# Patient Record
Sex: Male | Born: 1950 | Race: White | Hispanic: No | Marital: Married | Smoking: Never smoker
Health system: Southern US, Community
[De-identification: ages and names within clinical notes are randomized; demographics above are authoritative.]

## PROBLEM LIST (undated history)

## (undated) DIAGNOSIS — J45909 Unspecified asthma, uncomplicated: Secondary | ICD-10-CM

## (undated) DIAGNOSIS — J302 Other seasonal allergic rhinitis: Secondary | ICD-10-CM

## (undated) DIAGNOSIS — I350 Nonrheumatic aortic (valve) stenosis: Secondary | ICD-10-CM

## (undated) HISTORY — DX: Nonrheumatic aortic (valve) stenosis: I35.0

## (undated) HISTORY — PX: OTHER SURGICAL HISTORY: SHX169

## (undated) HISTORY — PX: X-STOP IMPLANTATION: SHX2677

## (undated) HISTORY — DX: Unspecified asthma, uncomplicated: J45.909

## (undated) HISTORY — DX: Other seasonal allergic rhinitis: J30.2

---

## 2014-02-06 ENCOUNTER — Other Ambulatory Visit: Payer: Self-pay | Admitting: Orthopedic Surgery

## 2014-02-06 DIAGNOSIS — M48061 Spinal stenosis, lumbar region without neurogenic claudication: Secondary | ICD-10-CM

## 2014-02-10 ENCOUNTER — Other Ambulatory Visit: Payer: Self-pay

## 2014-02-10 ENCOUNTER — Ambulatory Visit
Admission: RE | Admit: 2014-02-10 | Discharge: 2014-02-10 | Disposition: A | Discharge status: Home / Self Care | Payer: Federal, State, Local not specified - PPO | Source: Ambulatory Visit | Attending: Orthopedic Surgery | Admitting: Orthopedic Surgery

## 2014-02-10 VITALS — BP 145/82 | HR 69

## 2014-02-10 DIAGNOSIS — M48061 Spinal stenosis, lumbar region without neurogenic claudication: Secondary | ICD-10-CM

## 2014-02-10 MED ORDER — IOHEXOL 180 MG/ML  SOLN: 1.0000 mL | Freq: Once | INTRAMUSCULAR | Status: AC | PRN

## 2014-02-10 MED ORDER — METHYLPREDNISOLONE ACETATE 40 MG/ML INJ SUSP (RADIOLOG: 120.0000 mg | Freq: Once | INTRAMUSCULAR | Status: DC

## 2014-02-10 NOTE — Discharge Instructions (Signed)

## 2014-09-09 ENCOUNTER — Ambulatory Visit (INDEPENDENT_AMBULATORY_CARE_PROVIDER_SITE_OTHER)
Admission: RE | Admit: 2014-09-09 | Discharge: 2014-09-09 | Disposition: A | Discharge status: Home / Self Care | Payer: Federal, State, Local not specified - PPO | Source: Ambulatory Visit | Attending: Pulmonary Disease | Admitting: Pulmonary Disease

## 2014-09-09 ENCOUNTER — Encounter: Payer: Self-pay | Admitting: Pulmonary Disease

## 2014-09-09 ENCOUNTER — Ambulatory Visit (INDEPENDENT_AMBULATORY_CARE_PROVIDER_SITE_OTHER): Payer: Federal, State, Local not specified - PPO | Admitting: Pulmonary Disease

## 2014-09-09 ENCOUNTER — Encounter (INDEPENDENT_AMBULATORY_CARE_PROVIDER_SITE_OTHER): Payer: Self-pay

## 2014-09-09 VITALS — BP 116/68 | HR 68 | Ht 68.0 in | Wt 175.0 lb

## 2014-09-09 DIAGNOSIS — R05 Cough: Secondary | ICD-10-CM

## 2014-09-09 DIAGNOSIS — R053 Chronic cough: Secondary | ICD-10-CM

## 2014-09-09 IMAGING — CR DG CHEST 2V
2 series · 2 of 2 positions shown · non-contrast
Comparison: None.

CLINICAL DATA: Chronic cough, congestion for 5 months.

EXAM:
CHEST  2 VIEW

[view not recorded (1 of 2)]
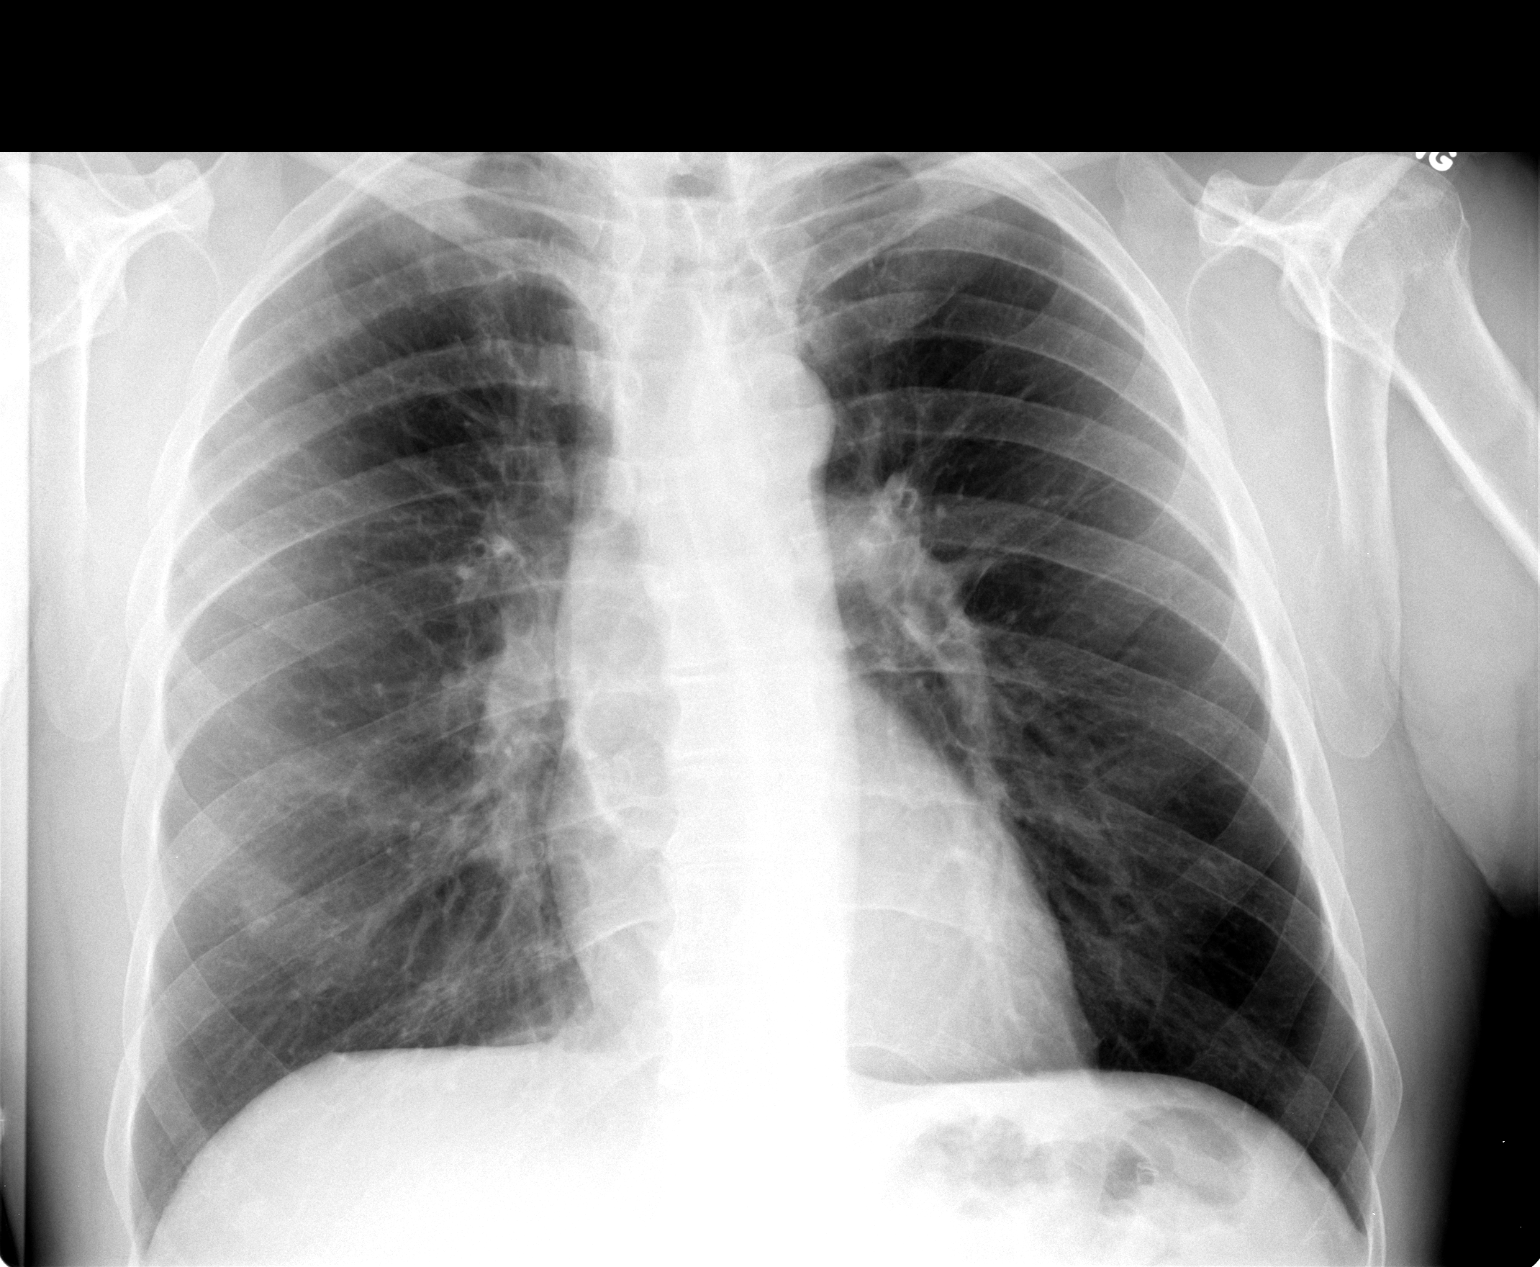

[view not recorded (2 of 2)]
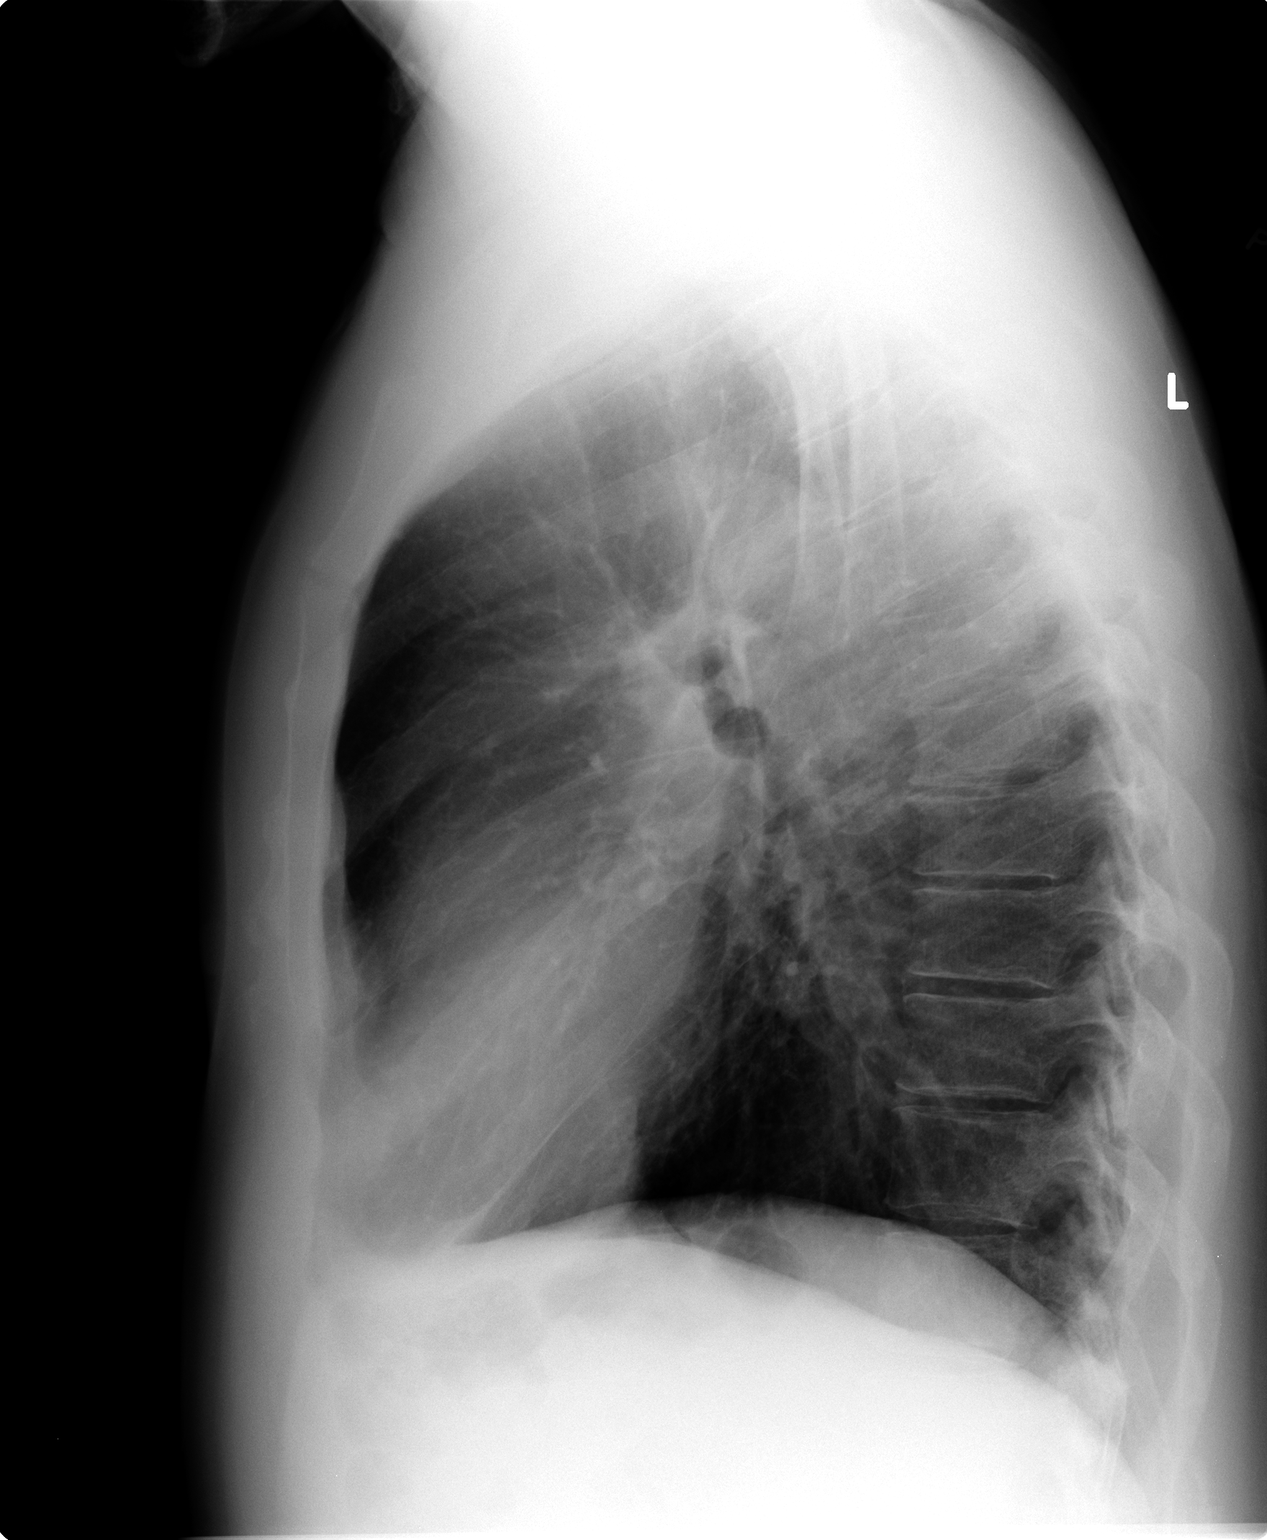

[2 of 2 positions shown; findings below may reference images not displayed]

FINDINGS: 8 mm nodular opacity projecting over the right lower lung. There is
no focal parenchymal opacity, pleural effusion, or pneumothorax. The
heart and mediastinal contours are unremarkable.

The osseous structures are unremarkable.
IMPRESSION: No active cardiopulmonary disease.

8 mm nodular opacity projecting over the right lower lung which may
reflect a nipple shadow. Recommend repeat chest x-ray with nipple
markers.

## 2014-09-09 MED ORDER — CHLORPHENIRAMINE TANNATE 8 MG PO TABS: 1.0000 | ORAL_TABLET | Freq: Two times a day (BID) | ORAL | Status: DC

## 2014-09-09 MED ORDER — PSEUDOEPHEDRINE HCL ER 120 MG PO TB12: 120.0000 mg | ORAL_TABLET | Freq: Every day | ORAL | Status: DC

## 2014-09-09 NOTE — Assessment & Plan Note (Addendum)
Your chronic cough is most likely related to sinus drip -less likely reflux. No reason to consider aspiration. I doubt this is cough variant asthma Suggest trial of empiric treatment x 3 weeks with - CHlorpheniramine 8mg  twice daily (might make you sleepy) Sudafed 60 XL twice daily Use DELSYM as needed for cough CXR today Call if worse or new symptoms

## 2014-09-09 NOTE — Patient Instructions (Signed)
Your chronic cough is most likely related to sinus drip -less likely reflux Suggest trial of empiric treatment x 3 weeks with - CHlorpheniramine 8mg  twice daily (might make you sleepy) Sudafed 60 XL twice daily Use DELSYM as needed for cough CXR today Call if worse or new symptoms

## 2014-09-09 NOTE — Progress Notes (Signed)
   Subjective:    Patient ID: Carlos BalesSteven Simon, male    DOB: Nov 25, 1950, 64 y.o.   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 He reports cough and congestion for 5 months ever since he had back surgery in 04/2014. He underwent laminectomy for spinal stenosis Yetta Barre(Carlos). He wonders of general anesthesia may have had something to do with this. The cough is deep, productive of small amounts of clear phlegm. He has tried over-the-counter Sudafed and DayQuil with some relief. He denies diurnal or seasonal variation. He developed a URI a week ago and his wife is being treated for the same. He was evaluated in an urgent care in 06/2014, chest x-ray was reported negative. Benzonatate and hydrocodone provided some relief. He reports asthma since childhood that subsided by age 64 . He denies wheezing and has never had an attack as an adult. He reports an occasional sinus drip, no seasonal allergies. He denies significant heartburn. Chest x-ray does not show infiltrates or effusions  Past Medical History  Diagnosis Date  . Seasonal allergies   . Asthma     Past Surgical History  Procedure Laterality Date  . Laminectomies    . X-stop implantation      No Known Allergies  History   Social History  . Marital Status: Married    Spouse Name: N/A  . Number of Children: N/A  . Years of Education: N/A   Occupational History  . Retired Education officer, communityDentist    Social History Main Topics  . Smoking status: Never Smoker   . Smokeless tobacco: Not on file  . Alcohol Use: 0.6 - 1.2 oz/week    1-2 Standard drinks or equivalent per week  . Drug Use: Not on file  . Sexual Activity: Not on file   Other Topics Concern  . Not on file   Social History Narrative  . No narrative on file    Family History  Problem Relation Age of Onset  . Aortic stenosis Mother   . Irregular heart beat Mother      Review of Systems  Constitutional: Negative for fever  and unexpected weight change.  HENT: Positive for congestion and postnasal drip. Negative for dental problem, ear pain, mouth sores, nosebleeds, rhinorrhea, sinus pressure, sneezing, sore throat and trouble swallowing.   Eyes: Negative for redness and itching.  Respiratory: Positive for cough. Negative for chest tightness, shortness of breath and wheezing.   Cardiovascular: Negative for palpitations and leg swelling.  Genitourinary: Negative for dysuria.  Neurological: Negative for headaches.  Hematological: Does not bruise/bleed easily.  Psychiatric/Behavioral: Negative for dysphoric mood.       Objective:   Physical Exam  Gen. Pleasant, well-nourished, in no distress, normal affect ENT - no lesions, no post nasal drip Neck: No JVD, no thyromegaly, no carotid bruits Lungs: no use of accessory muscles, no dullness to percussion, clear without rales or rhonchi  Cardiovascular: Rhythm regular, heart sounds  normal, no murmurs or gallops, no peripheral edema Abdomen: soft and non-tender, no hepatosplenomegaly, BS normal. Musculoskeletal: No deformities, no cyanosis or clubbing Neuro:  alert, non focal       Assessment & Plan:

## 2014-10-06 ENCOUNTER — Telehealth: Payer: Self-pay | Admitting: Pulmonary Disease

## 2014-10-06 NOTE — Telephone Encounter (Signed)
Pt wants copies mailed to him. I have confirmed address. Nothing further needed

## 2014-10-08 ENCOUNTER — Ambulatory Visit: Payer: Federal, State, Local not specified - PPO | Admitting: Adult Health

## 2014-10-10 ENCOUNTER — Telehealth: Payer: Self-pay

## 2014-10-10 NOTE — Telephone Encounter (Signed)
10/10/14 DG Chest 2V ROI on disc to front desk for patient to pick up. Carlos Simon

## 2019-05-24 ENCOUNTER — Ambulatory Visit: Payer: Federal, State, Local not specified - PPO | Attending: Internal Medicine

## 2019-05-24 DIAGNOSIS — Z23 Encounter for immunization: Secondary | ICD-10-CM

## 2019-06-14 ENCOUNTER — Other Ambulatory Visit: Payer: Self-pay

## 2019-06-14 ENCOUNTER — Ambulatory Visit: Payer: Federal, State, Local not specified - PPO | Attending: Internal Medicine

## 2019-06-14 DIAGNOSIS — Z23 Encounter for immunization: Secondary | ICD-10-CM | POA: Insufficient documentation

## 2019-06-14 NOTE — Progress Notes (Signed)
   Covid-19 Vaccination Clinic  Name:  Sadiq Mccauley    MRN: [id number] DOB: [birth year]  06/14/2019  Mr. Karnes was observed post Covid-19 immunization for 15 minutes without incidence. He was provided with Vaccine Information Sheet and instruction to access the V-Safe system.   Mr. Liszewski was instructed to call 911 with any severe reactions post vaccine: Marland Kitchen Difficulty breathing  . Swelling of your face and throat  . A fast heartbeat  . A bad rash all over your body  . Dizziness and weakness    Immunizations Administered    Name Date Dose VIS Date Route   Pfizer COVID-19 Vaccine 06/14/2019  2:49 PM 0.3 mL 04/12/2019 Intramuscular   Manufacturer: [employer], [employer]   Lot: [id number]   NDC: [address]-3

## 2020-01-30 ENCOUNTER — Ambulatory Visit: Payer: Federal, State, Local not specified - PPO

## 2020-01-30 ENCOUNTER — Ambulatory Visit: Payer: Federal, State, Local not specified - PPO | Attending: Internal Medicine

## 2020-01-30 DIAGNOSIS — Z23 Encounter for immunization: Secondary | ICD-10-CM

## 2020-01-30 NOTE — Progress Notes (Signed)
   Covid-19 Vaccination Clinic  Name:  Carlos Simon    MRN: [id number] DOB: [birth year]  01/30/2020  Mr. Carlos Simon was observed post Covid-19 immunization for 15 minutes without incident. He was provided with Vaccine Information Sheet and instruction to access the V-Safe system.   Mr. Carlos Simon was instructed to call 911 with any severe reactions post vaccine: Marland Kitchen Difficulty breathing  . Swelling of face and throat  . A fast heartbeat  . A bad rash all over body  . Dizziness and weakness

## 2022-05-17 ENCOUNTER — Telehealth (HOSPITAL_COMMUNITY): Payer: Self-pay

## 2022-05-17 NOTE — Telephone Encounter (Signed)
Called and spoke with pt in regards to CR, pt stated he is not interested at this time.  

## 2022-05-17 NOTE — Telephone Encounter (Signed)
Outside/paper referral received by Dr. Duke Salvia from [facility]. Will fax over Physician order and request further documents. Insurance benefits and eligibility to be determined.

## 2023-10-03 NOTE — Progress Notes (Unsigned)
 Patient ID: Carlos Simon MRN: [id number] DOB/AGE: 1950/11/17 73 y.o.  Primary Care Physician:Gates, Porfirio Bristol, MD (Inactive) Primary Cardiologist: [facility])  CC:  Aortic valvular disease management     FOCUSED PROBLEM LIST:   Aortic stenosis AVA 1.3, MG 46, V-max 4.2 (at rest indices) exercise echo stress test 2024 AVA TTE*** EKG sinus rhythm, incomplete right bundle branch block, and left anterior fascicular block CAD Status post PCI pLAD 2024 Hyperlipidemia BMI 27/BSA 1.95  June 2025:  Patient consents to use of AI scribe. The patient is a 73 year old male with the above listed medical problems referred for recommendations regarding his aortic valvular disease.  On review of his care everywhere chart the patient has had severe aortic stenosis for some time.  He had an exercise treadmill stress echocardiogram stress test done in 2024 where he was able to achieve good exercise capacity.          Past Medical History:  Diagnosis Date   Asthma    Seasonal allergies     *** The histories are not reviewed yet. Please review them in the "History" navigator section and refresh this SmartLink.  Family History  Problem Relation Age of Onset   Aortic stenosis Mother    Irregular heart beat Mother     Social History   Socioeconomic History   Marital status: Married    Spouse name: Not on file   Number of children: Not on file   Years of education: Not on file   Highest education level: Not on file  Occupational History   Occupation: Retired Education officer, community  Tobacco Use   Smoking status: Never   Smokeless tobacco: Not on file  Substance and Sexual Activity   Alcohol use: Yes    Alcohol/week: 1.0 - 2.0 standard drink of alcohol    Types: 1 - 2 Standard drinks or equivalent per week   Drug use: Not on file   Sexual activity: Not on file  Other Topics Concern   Not on file  Social History Narrative   Not on file   Social Drivers of Health   Financial  Resource Strain: Not on file  Food Insecurity: Low Risk  (05/09/2022)   [facility]   Hunger Vital Sign    Worried About Running Out of Food in the Last Year: Never true    Within the past 12 months, the food you bought just didn't last and you didn't have money to get more: Not on file  Transportation Needs: No Transportation Needs (05/09/2022)   [facility]   Transportation    In the past 12 months, has lack of reliable transportation kept you from medical appointments, meetings, work or from getting things needed for daily living? : No  Physical Activity: Not on file  Stress: Not on file  Social Connections: Not on file  Intimate Partner Violence: Low Risk  (05/09/2022)   [facility] visits prior to 07/02/2022., [facility] visits prior to 07/02/2022.   Safety    How often does anyone, including family and friends, physically hurt you?: Never    How often does anyone, including family and friends, insult or talk down to you?: Never    How often does anyone, including family and friends, threaten you with harm?: Never    How often does anyone, including family and friends, scream or curse at you?: Never     Prior  to Admission medications   Medication Sig Start Date End Date Taking? Authorizing Provider  aspirin 81 MG tablet Take 81 mg by mouth daily.    [provider]  Chlorpheniramine  Tannate 8 MG TABS Take 1 tablet (8 mg total) by mouth 2 (two) times daily. 09/09/14   Lind Repine, MD  pseudoephedrine  (SUDAFED 12 HOUR) 120 MG 12 hr tablet Take 1 tablet (120 mg total) by mouth daily. 09/09/14   Lind Repine, MD    No Known Allergies  REVIEW OF SYSTEMS:  General: no fevers/chills/night sweats Eyes: no blurry vision, diplopia, or amaurosis ENT: no sore throat or hearing loss Resp: no cough, wheezing, or hemoptysis CV: no edema or palpitations GI: no abdominal  pain, nausea, vomiting, diarrhea, or constipation GU: no dysuria, frequency, or hematuria Skin: no rash Neuro: no headache, numbness, tingling, or weakness of extremities Musculoskeletal: no joint pain or swelling Heme: no bleeding, DVT, or easy bruising Endo: no polydipsia or polyuria  There were no vitals taken for this visit.  PHYSICAL EXAM: GEN:  AO x 3 in no acute distress HEENT: normal Dentition: Normal*** Neck: JVP normal. +2***carotid upstrokes without bruits. No thyromegaly. Lungs: equal expansion, clear bilaterally CV: Apex is discrete and nondisplaced, RRR without murmur or gallop*** Abd: soft, non-tender, non-distended; no bruit; positive bowel sounds Ext: no edema, ecchymoses, or cyanosis Vascular: 2+ femoral pulses, 2+ radial pulses       Skin: warm and dry without rash Neuro: CN II-XII grossly intact; motor and sensory grossly intact    DATA AND STUDIES:  EKG:  EKG Interpretation Date/Time:    Ventricular Rate:    PR Interval:    QRS Duration:    QT Interval:    QTC Calculation:   R Axis:      Text Interpretation:              No results found for requested labs within last 365 days.   STS RISK CALCULATOR: Pending  NHYA CLASS: ***    ASSESSMENT AND PLAN:   1. Nonrheumatic aortic valve stenosis   2. Coronary artery disease involving native coronary artery of native heart without angina pectoris   3. Hyperlipidemia LDL goal <70   4. 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 Hyperlipidemia: Continue Lipitor 40 mg. Bifascicular block: At higher risk for permanent pacemaker if a TAVR is pursued.   I have personally reviewed the patients imaging data as summarized above.  I have reviewed the natural history of aortic stenosis with the patient and family members who are present today. We have discussed the  limitations of medical therapy and the poor prognosis associated with symptomatic aortic stenosis. We have also reviewed potential treatment options, including palliative medical therapy, conventional surgical aortic valve replacement, and transcatheter aortic valve replacement. We discussed treatment options in the context of this patient's specific comorbid medical conditions.   All of the patient's questions were answered today. Will make further recommendations based on the results of studies outlined above.   I spent *** minutes reviewing all clinical data during and prior to this visit including all relevant imaging studies, laboratories, clinical information from other health systems and prior notes from both Cardiology and other specialties, interviewing the patient, conducting a complete physical examination, and coordinating care in order to formulate a comprehensive and personalized evaluation and treatment plan.   Susana Gripp K  Lorie Rook, MD  10/03/2023 4:05 PM    [facility] [address] [place], [place], [place]  [address] Phone: [phone]; Fax: [phone]

## 2023-10-06 ENCOUNTER — Inpatient Hospital Stay
Admission: RE | Admit: 2023-10-06 | Discharge: 2023-10-06 | Disposition: A | Discharge status: Home / Self Care | Payer: Self-pay | Source: Ambulatory Visit | Attending: Internal Medicine | Admitting: Internal Medicine

## 2023-10-06 ENCOUNTER — Other Ambulatory Visit (HOSPITAL_COMMUNITY): Payer: Self-pay

## 2023-10-06 ENCOUNTER — Other Ambulatory Visit: Payer: Self-pay

## 2023-10-06 ENCOUNTER — Ambulatory Visit: Attending: Internal Medicine | Admitting: Internal Medicine

## 2023-10-06 ENCOUNTER — Encounter: Payer: Self-pay | Admitting: Internal Medicine

## 2023-10-06 VITALS — BP 118/74 | HR 78 | Ht 68.0 in | Wt 151.0 lb

## 2023-10-06 DIAGNOSIS — I350 Nonrheumatic aortic (valve) stenosis: Secondary | ICD-10-CM

## 2023-10-06 DIAGNOSIS — I2510 Atherosclerotic heart disease of native coronary artery without angina pectoris: Secondary | ICD-10-CM

## 2023-10-06 DIAGNOSIS — I452 Bifascicular block: Secondary | ICD-10-CM | POA: Diagnosis not present

## 2023-10-06 DIAGNOSIS — E785 Hyperlipidemia, unspecified: Secondary | ICD-10-CM | POA: Diagnosis not present

## 2023-10-06 DIAGNOSIS — Z01812 Encounter for preprocedural laboratory examination: Secondary | ICD-10-CM

## 2023-10-06 MED ORDER — METOPROLOL TARTRATE 50 MG PO TABS
ORAL_TABLET | ORAL | 0 refills | Status: DC
  Filled 2023-10-06: qty 1, 1d supply, fill #0

## 2023-10-06 MED ORDER — METOPROLOL SUCCINATE ER 50 MG PO TB24
50.0000 mg | ORAL_TABLET | Freq: Once | ORAL | 0 refills | Status: DC
  Filled 2023-10-06: qty 1, 1d supply, fill #0

## 2023-10-06 NOTE — Addendum Note (Signed)
 Addended by: Jeana Kersting on: 10/06/2023 03:05 PM   Modules accepted: Orders

## 2023-10-06 NOTE — Patient Instructions (Signed)
 Medication Instructions:  No changes  Lab Work: Today: first floor lab for blood work (bmet)  Testing/Procedures: CT scans - see instructions provided today.  Follow-Up: Per Structural Heart Team

## 2023-10-06 NOTE — Progress Notes (Addendum)
 Pre Surgical Assessment: 5 M Walk Test  66M=16.54ft  5 Meter Walk Test- trial 1: 6.22 seconds 5 Meter Walk Test- trial 2: 5.62 seconds 5 Meter Walk Test- trial 3: 4.56 seconds 5 Meter Walk Test Average: 5.46 seconds  ___________________________  STS score Procedure Type: Isolated AVR Perioperative Outcome Estimate % Operative Mortality 1.47% Morbidity & Mortality 7.07% Stroke 0.851% Renal Failure 0.626% Reoperation 4.57% Prolonged Ventilation 2.15% Deep Sternal Wound Infection 0.039% Long Hospital Stay (>14 days) 2.33% Short Hospital Stay (<6 days)* 60.9%

## 2023-10-07 ENCOUNTER — Ambulatory Visit: Payer: Self-pay | Admitting: Internal Medicine

## 2023-10-07 LAB — BASIC METABOLIC PANEL WITH GFR
BUN/Creatinine Ratio: 14 (ref 10–24)
BUN: 11 mg/dL (ref 8–27)
CO2: 22 mmol/L (ref 20–29)
Calcium: 9.9 mg/dL (ref 8.6–10.2)
Chloride: 100 mmol/L (ref 96–106)
Creatinine, Ser: 0.81 mg/dL (ref 0.76–1.27)
Glucose: 94 mg/dL (ref 70–99)
Potassium: 4.8 mmol/L (ref 3.5–5.2)
Sodium: 138 mmol/L (ref 134–144)
eGFR: 93 mL/min/{1.73_m2} (ref >59)

## 2023-10-09 ENCOUNTER — Other Ambulatory Visit: Payer: Self-pay

## 2023-10-09 ENCOUNTER — Emergency Department (HOSPITAL_BASED_OUTPATIENT_CLINIC_OR_DEPARTMENT_OTHER)
Admission: EM | Admit: 2023-10-09 | Discharge: 2023-10-09 | Disposition: A | Discharge status: Home / Self Care | Attending: Emergency Medicine | Admitting: Emergency Medicine

## 2023-10-09 ENCOUNTER — Encounter (HOSPITAL_BASED_OUTPATIENT_CLINIC_OR_DEPARTMENT_OTHER): Payer: Self-pay | Admitting: Emergency Medicine

## 2023-10-09 ENCOUNTER — Emergency Department (HOSPITAL_BASED_OUTPATIENT_CLINIC_OR_DEPARTMENT_OTHER): Admitting: Radiology

## 2023-10-09 DIAGNOSIS — Z7982 Long term (current) use of aspirin: Secondary | ICD-10-CM | POA: Insufficient documentation

## 2023-10-09 DIAGNOSIS — M7981 Nontraumatic hematoma of soft tissue: Secondary | ICD-10-CM | POA: Diagnosis not present

## 2023-10-09 DIAGNOSIS — I350 Nonrheumatic aortic (valve) stenosis: Secondary | ICD-10-CM | POA: Diagnosis not present

## 2023-10-09 DIAGNOSIS — R42 Dizziness and giddiness: Secondary | ICD-10-CM | POA: Diagnosis present

## 2023-10-09 DIAGNOSIS — Z79899 Other long term (current) drug therapy: Secondary | ICD-10-CM | POA: Insufficient documentation

## 2023-10-09 DIAGNOSIS — S5012XA Contusion of left forearm, initial encounter: Secondary | ICD-10-CM

## 2023-10-09 LAB — URINALYSIS, ROUTINE W REFLEX MICROSCOPIC
Bilirubin Urine: NEGATIVE
Glucose, UA: NEGATIVE mg/dL
Hgb urine dipstick: NEGATIVE
Ketones, ur: NEGATIVE mg/dL
Leukocytes,Ua: NEGATIVE
Nitrite: NEGATIVE
Protein, ur: NEGATIVE mg/dL
Specific Gravity, Urine: 1.019 (ref 1.005–1.030)
pH: 6 (ref 5.0–8.0)

## 2023-10-09 LAB — COMPREHENSIVE METABOLIC PANEL WITH GFR
ALT: 35 U/L (ref 0–44)
AST: 36 U/L (ref 15–41)
Albumin: 4.4 g/dL (ref 3.5–5.0)
Alkaline Phosphatase: 69 U/L (ref 38–126)
Anion gap: 13 (ref 5–15)
BUN: 8 mg/dL (ref 8–23)
CO2: 22 mmol/L (ref 22–32)
Calcium: 9.9 mg/dL (ref 8.9–10.3)
Chloride: 101 mmol/L (ref 98–111)
Creatinine, Ser: 0.78 mg/dL (ref 0.61–1.24)
GFR, Estimated: >60 mL/min (ref >60)
Glucose, Bld: 119 mg/dL — ABNORMAL HIGH (ref 70–99)
Potassium: 4.1 mmol/L (ref 3.5–5.1)
Sodium: 136 mmol/L (ref 135–145)
Total Bilirubin: 0.9 mg/dL (ref 0.0–1.2)
Total Protein: 7.1 g/dL (ref 6.5–8.1)

## 2023-10-09 LAB — CBC
HCT: 39.4 % (ref 39.0–52.0)
Hemoglobin: 14 g/dL (ref 13.0–17.0)
MCH: 30.8 pg (ref 26.0–34.0)
MCHC: 35.5 g/dL (ref 30.0–36.0)
MCV: 86.8 fL (ref 80.0–100.0)
Platelets: 242 10*3/uL (ref 150–400)
RBC: 4.54 MIL/uL (ref 4.22–5.81)
RDW: 12.1 % (ref 11.5–15.5)
WBC: 6.6 10*3/uL (ref 4.0–10.5)
nRBC: 0 % (ref 0.0–0.2)

## 2023-10-09 LAB — PROTIME-INR
INR: 1 (ref 0.8–1.2)
Prothrombin Time: 13.4 s (ref 11.4–15.2)

## 2023-10-09 LAB — PRO BRAIN NATRIURETIC PEPTIDE: Pro Brain Natriuretic Peptide: 379 pg/mL — ABNORMAL HIGH (ref <300.0)

## 2023-10-09 LAB — TROPONIN T, HIGH SENSITIVITY: Troponin T High Sensitivity: 18 ng/L (ref <19)

## 2023-10-09 NOTE — ED Triage Notes (Addendum)
 Hx of aortic stenosis, c/o feeling lightheaded for several days with high BP readings at home. Heart cath done 2 weeks ago. Also reports swelling to L arm after applying dry skin cream to his arm.

## 2023-10-09 NOTE — ED Provider Notes (Signed)
 [facility] AT [facility] Provider Note   CSN: [id number] Arrival date & time: 10/09/23  6387     History  Chief Complaint  Patient presents with   Dizziness    Carlos Simon is a 73 y.o. male.   Dizziness Associated symptoms: no chest pain and no shortness of breath      73 year old male with medical history significant for aortic stenosis currently being worked up outpatient for consideration for TAVR, recent outpatient cardiac catheterization who presents to the emergency department with multiple complaints.  His first concern is of bruising of his left forearm.  He denies any falls or trauma.  He states that he underwent cardiac catheterization with access through the radial artery and had a pressure dressing in place for 5 days.  Last Monday, the dressing had come off and he was applying Eucerin cream to his forearm when he developed sudden onset swelling at the site of catheterization with subsequent development of hematoma throughout his forearm.  He was seen outpatient for the hematoma was advised and called his cardiologist and was advised elevation, initial icing of the affected area.  He has not had rapid expansion of hematoma and the affected area is now 29 days old and appears to be healing, no surrounding erythema, does endorse some mild warmth.  No fevers.  Per his cardiology notes, this was attributed to applying lotion against postoperative instructions.  He had been managing the bruising with rest ice and elevation.  He was seen in the cardiology clinic on 10/06/2023.  He additionally states that he has had intermittent lightheadedness associated with his cardiac catheterization that has worsened over the past 2 days.  He denies any chest pain or shortness of breath.  No lower extremity swelling.  No exertional dyspnea.  No episodes of syncope.  He endorses bilateral paresthesias, denies any focal neurologic deficits, no difficulty swallowing or  speaking, no numbness or weakness in any extremity, no facial droop.  No room spinning dizziness.  He is being referred for outpatient CTA TAVR protocol and a CT surgical appointment.  He was advised to represent to the emergency department if you develop worsening symptoms associated with his aortic stenosis.  Home Medications Prior to Admission medications   Medication Sig Start Date End Date Taking? Authorizing Provider  aspirin 81 MG tablet Take 81 mg by mouth daily.    [provider]  atorvastatin (LIPITOR) 40 MG tablet Take 40 mg by mouth daily.    [provider]  HYDROPHILIC EX Apply topically.    [provider]  metoprolol  tartrate (LOPRESSOR ) 50 MG tablet Take one tablet by mouth two hours before the CT scan 10/06/23   Thukkani, Arun K, MD  psyllium (METAMUCIL) 58.6 % powder Take 1 packet by mouth 3 (three) times daily.    [provider]      Allergies    Patient has no known allergies.    Review of Systems   Review of Systems  Respiratory:  Negative for shortness of breath.   Cardiovascular:  Negative for chest pain and leg swelling.  Skin:        Bruising  Neurological:  Positive for light-headedness.  All other systems reviewed and are negative.   Physical Exam Updated Vital Signs BP (!) 141/95   Pulse 74   Temp 98.2 F (36.8 C) (Oral)   Resp 19   SpO2 100%  Physical Exam Vitals and nursing note reviewed.  Constitutional:  General: He is not in acute distress.    Appearance: He is well-developed.  HENT:     Head: Normocephalic and atraumatic.  Eyes:     Conjunctiva/sclera: Conjunctivae normal.  Cardiovascular:     Rate and Rhythm: Normal rate and regular rhythm.     Heart sounds: Murmur heard.     Comments: Loud systolic murmur present Pulmonary:     Effort: Pulmonary effort is normal. No respiratory distress.     Breath sounds: Normal breath sounds. No rales.  Abdominal:     Palpations: Abdomen is soft.      Tenderness: There is no abdominal tenderness.  Musculoskeletal:        General: No swelling.     Cervical back: Neck supple.     Right lower leg: No edema.     Left lower leg: No edema.     Comments: Area of healing hematoma along the left forearm, compartments appear soft, 2+ radial pulses, intact motor function along the median ulnar radial nerve distributions  Skin:    General: Skin is warm and dry.     Capillary Refill: Capillary refill takes less than 2 seconds.  Neurological:     Mental Status: He is alert.  Psychiatric:        Mood and Affect: Mood normal.      ED Results / Procedures / Treatments   Labs (all labs ordered are listed, but only abnormal results are displayed) Labs Reviewed  COMPREHENSIVE METABOLIC PANEL WITH GFR - Abnormal; Notable for the following components:      Result Value   Glucose, Bld 119 (*)    All other components within normal limits  CBC  URINALYSIS, ROUTINE W REFLEX MICROSCOPIC  PROTIME-INR  PRO BRAIN NATRIURETIC PEPTIDE  TROPONIN T, HIGH SENSITIVITY    EKG None   Radiology DG Chest 2 View Result Date: 10/09/2023 CLINICAL DATA:  Lightheadedness. EXAM: CHEST - 2 VIEW COMPARISON:  09/09/2014 FINDINGS: The lungs are clear without focal pneumonia, edema, pneumothorax or pleural effusion. The cardiopericardial silhouette is within normal limits for size. No acute bony abnormality. Telemetry leads overlie the chest. IMPRESSION: No active cardiopulmonary disease. Electronically Signed   By: Donnal Fusi M.D.   On: 10/09/2023 08:49    Procedures Procedures    Medications Ordered in ED Medications - No data to display  ED Course/ Medical Decision Making/ A&P                                 Medical Decision Making Amount and/or Complexity of Data Reviewed Labs: ordered. Radiology: ordered.   72 year old male with medical history significant for aortic stenosis currently being worked up outpatient for consideration for TAVR, recent  outpatient cardiac catheterization who presents to the emergency department with multiple complaints.  His first concern is of bruising of his left forearm.  He denies any falls or trauma.  He states that he underwent cardiac catheterization with access through the radial artery and had a pressure dressing in place for 5 days.  Last Monday, the dressing had come off and he was applying Eucerin cream to his forearm when he developed sudden onset swelling at the site of catheterization with subsequent development of hematoma throughout his forearm.  He was seen outpatient for the hematoma was advised and called his cardiologist and was advised elevation, initial icing of the affected area.  He has not had rapid expansion of hematoma and  the affected area is now 49 days old and appears to be healing, no surrounding erythema, does endorse some mild warmth.  No fevers.  Per his cardiology notes, this was attributed to applying lotion against postoperative instructions.  He had been managing the bruising with rest ice and elevation.  He was seen in the cardiology clinic on 10/06/2023.  He additionally states that he has had intermittent lightheadedness associated with his cardiac catheterization that has worsened over the past 2 days.  He denies any chest pain or shortness of breath.  No lower extremity swelling.  No exertional dyspnea.  No episodes of syncope.  He endorses bilateral paresthesias, denies any focal neurologic deficits, no difficulty swallowing or speaking, no numbness or weakness in any extremity, no facial droop.  No room spinning dizziness.  He is being referred for outpatient CTA TAVR protocol and a CT surgical appointment.  He was advised to represent to the emergency department if you develop worsening symptoms associated with his aortic stenosis.  On arrival, the patient was afebrile, hemodynamically stable, not tachycardic or tachypneic, BP 125/94, saturating 99% on room air.  Physical exam as per  above with a normal neurologic exam, hematoma to left forearm which appears to be healing, does not appear to be infected with no erythema, patient's left arm is neurovascularly intact.  Per outpatient cardiology notes, this is likely a post catheterization bleed due to application of cream to operative site against postoperative instructions.  I see no evidence of expanding hematoma, no evidence of compartment syndrome and the patient's left forearm appears to be neurovascular intact.  He presents with episodic lightheadedness which appears to be worsening over the last 2 days.  Suspect component of aortic stenosis and anxiety as the etiology of his presentation.  He denies any chest pain, has had no exertional dyspnea and no lower extremity swelling and no episodes of syncope.  His neurologic exam is intact.  EKG: Sinus rhythm, ventricular rate 74, no acute ischemic changes or abnormal intervals.  CXR: IMPRESSION:  No active cardiopulmonary disease.    Cardiology consult: Discussed with on-call cardiology, Dr. Chancy Comber who agreed no further intervention indicated for the patient's hematoma of the forearm, continue with outpatient elevation of the extremity.  Will take time to heal.  Regarding the patient's symptoms of aortic stenosis, she advised that the patient follow-up outpatient this week. If labs are reassuring, no indication for inpatient hospitalization at this time.   Labs: INR normal, initial cardiac troponin 18 and the patient denies any active chest pain, low concern for ACS, BNP normal after age adjustment, CMP without electrolyte abnormality, urinalysis negative, CBC without a leukocytosis or anemia.  The patient is neurologically intact without neurologic deficit, shows no evidence of heart failure exacerbation, no evidence of CVA.  He is not anemic.  He has no electrolyte abnormality.  Lungs are clear.  Patient counseled regarding his hematoma and advised continued outpatient workup  regarding his aortic stenosis.  Final Clinical Impression(s) / ED Diagnoses Final diagnoses:  Aortic valve stenosis, etiology of cardiac valve disease unspecified  Episodic lightheadedness    Rx / DC Orders ED Discharge Orders     None         Rosealee Concha, MD 10/09/23 [phone]

## 2023-10-09 NOTE — Discharge Instructions (Addendum)
 Please follow-up with cardiology this week.  Your EKG, chest x-ray and screening laboratory evaluation was overall reassuring.  I discussed your case with on-call cardiology who advised close follow-up this week with your cardiologist and continued outpatient evaluation for your TAVR

## 2023-10-09 NOTE — ED Notes (Signed)
 DC paperwork given and verbally understood.

## 2023-10-19 ENCOUNTER — Ambulatory Visit (HOSPITAL_COMMUNITY)
Admission: RE | Admit: 2023-10-19 | Discharge: 2023-10-19 | Disposition: A | Discharge status: Home / Self Care | Source: Ambulatory Visit | Attending: Cardiology | Admitting: Cardiology

## 2023-10-19 DIAGNOSIS — I728 Aneurysm of other specified arteries: Secondary | ICD-10-CM | POA: Insufficient documentation

## 2023-10-19 DIAGNOSIS — I350 Nonrheumatic aortic (valve) stenosis: Secondary | ICD-10-CM | POA: Diagnosis present

## 2023-10-19 DIAGNOSIS — I2510 Atherosclerotic heart disease of native coronary artery without angina pectoris: Secondary | ICD-10-CM | POA: Insufficient documentation

## 2023-10-19 DIAGNOSIS — I7121 Aneurysm of the ascending aorta, without rupture: Secondary | ICD-10-CM | POA: Insufficient documentation

## 2023-10-19 MED ORDER — IOHEXOL 350 MG/ML SOLN
100.0000 mL | Freq: Once | INTRAVENOUS | Status: AC | PRN
  Administered 2023-10-19: 100 mL via INTRAVENOUS

## 2023-10-22 ENCOUNTER — Ambulatory Visit: Payer: Self-pay | Admitting: Internal Medicine

## 2023-10-23 ENCOUNTER — Telehealth: Payer: Self-pay | Admitting: Internal Medicine

## 2023-10-23 NOTE — Telephone Encounter (Signed)
 Pt is requesting a callback at [phone] from nurse Tinnie Daring, please advise.

## 2023-10-23 NOTE — Telephone Encounter (Signed)
 Spoke with the patient at length.  He would like his surgical appointment with Dr. Lucas moved up sooner. He saw the results of his CT with the elevated aortic valve calcium score and knows it is in the severe range.   He reported that he did not have any real symptoms until about 6 weeks ago. Now he experiences lightheadedness and dizziness that seem to improve a little throughout the day, but he said it is affecting his lifestyle as he does not want to drive. He complained of tingling in his fingers but he also reported a neck injury with possible nerve damage (he has a MRI to assess neck on 12/06/2023).   Discussed in detail the TAVR process. Reiterated to him that Dr. Lucas has no availability prior to his already scheduled appointment, but he will be called if a cancellation occurs.  He was grateful for call and agreed with plan.

## 2023-11-08 ENCOUNTER — Encounter: Admitting: Surgery

## 2023-11-09 ENCOUNTER — Ambulatory Visit: Attending: Surgery | Admitting: Surgery

## 2023-11-09 VITALS — BP 158/89 | HR 81 | Resp 20 | Ht 68.0 in | Wt 151.0 lb

## 2023-11-09 DIAGNOSIS — I350 Nonrheumatic aortic (valve) stenosis: Secondary | ICD-10-CM

## 2023-11-10 ENCOUNTER — Encounter: Payer: Self-pay | Admitting: Surgery

## 2023-11-10 NOTE — Progress Notes (Signed)
 Patient ID: Carlos Simon, male   DOB: [birth year], 73 y.o.   MRN: [id number]   [facility]   MULTIDISCIPLINARY HEART VALVE CLINIC   CARDIOTHORACIC SURGERY CONSULTATION REPORT  PCP is [facility] Referring Provider is Lurena Red, MD Primary Cardiologist is Dr. 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  He is cared for in the [place] system and a recent echocardiogram showed severe aortic stenosis with a mean gradient of 48 mmHg and a peak velocity near 5 m/s.  Cardiac catheterization in 08/2023 showed a widely patent LAD stent with otherwise minimal nonobstructive disease.  He reports a several month history of intermittent lightheadedness that has occurred with change in position as well as turning quickly.  He has had no syncope.  He denies any chest pain or pressure.  He said no shortness of breath.  Past Medical History:  Diagnosis Date   Aortic stenosis    Asthma    Seasonal allergies     Past Surgical History:  Procedure Laterality Date   laminectomies     X-STOP IMPLANTATION      Family History  Problem Relation Age of Onset   Aortic stenosis Mother    Irregular heart beat Mother     Social History   Socioeconomic History   Marital status: Married    Spouse name: Not on file   Number of children: Not on file   Years of education: Not on file   Highest education level: Not on file  Occupational History   Occupation: Retired Education officer, community  Tobacco Use   Smoking status: Never   Smokeless tobacco: Not on file  Substance and Sexual Activity   Alcohol use: Yes    Alcohol/week: 1.0 - 2.0 standard drink of alcohol    Types: 1 - 2 Standard drinks or equivalent per week   Drug use: Not on file    Sexual activity: Not on file  Other Topics Concern   Not on file  Social History Narrative   Not on file   Social Drivers of Health   Financial Resource Strain: Not on file  Food Insecurity: Low Risk  (05/09/2022)   Received from [facility]   Hunger Vital Sign    Within the past 12 months, you worried that your food would run out before you got money to buy more: Never true    Within the past 12 months, the food you bought just didn't last and you didn't have money to get more: Not on file  Transportation Needs: No Transportation Needs (05/09/2022)   Received from [facility]    In the past 12 months, has lack of reliable transportation kept you from medical appointments, meetings, work or from getting things needed for daily living? : No  Physical Activity: Not on file  Stress: Not on file  Social Connections: Not on file  Intimate Partner Violence: Low Risk  (05/09/2022)   Received from [facility] visits prior to 07/02/2022.   Safety    How often does anyone, including family and friends, physically hurt you?: Never    How often does anyone, including family and friends, insult or talk down to you?: Never    How often does anyone,  including family and friends, threaten you with harm?: Never    How often does anyone, including family and friends, scream or curse at you?: Never    Prior to Admission medications   Medication Sig Start Date End Date Taking? Authorizing Provider  aspirin 81 MG tablet Take 81 mg by mouth daily.   Yes [provider]  atorvastatin (LIPITOR) 40 MG tablet Take 40 mg by mouth daily.   Yes [provider]  HYDROPHILIC EX Apply topically.   Yes [provider]  metoprolol  tartrate (LOPRESSOR ) 50 MG tablet Take one tablet by mouth two hours before the CT scan 10/06/23  Yes Thukkani, Arun K, MD  psyllium (METAMUCIL) 58.6 % powder Take 1 packet by mouth 3 (three) times daily.   Yes [provider]    Current Outpatient Medications  Medication Sig Dispense Refill   aspirin 81 MG tablet Take 81 mg by mouth daily.     atorvastatin (LIPITOR) 40 MG tablet Take 40 mg by mouth daily.     HYDROPHILIC EX Apply topically.     metoprolol  tartrate (LOPRESSOR ) 50 MG tablet Take one tablet by mouth two hours before the CT scan 1 tablet 0   psyllium (METAMUCIL) 58.6 % powder Take 1 packet by mouth 3 (three) times daily.     No current facility-administered medications for this visit.    No Known Allergies    Review of Systems:   General:  normal appetite, + decreased energy, no weight gain, no weight loss, no fever  Cardiac:  no chest pain with exertion, no chest pain at rest, no SOB with  exertion, no resting SOB, no PND, no orthopnea, no palpitations, no arrhythmia, no atrial fibrillation, no LE edema, + dizzy spells, no syncope  Respiratory:  no shortness of breath, no home oxygen, no productive cough, no dry cough, no bronchitis, no wheezing, no hemoptysis, no asthma, no pain with inspiration or cough, no sleep apnea, no CPAP at night  GI:   no difficulty swallowing, no reflux, no frequent heartburn, no hiatal hernia, no abdominal pain, no constipation, no diarrhea, no hematochezia, no hematemesis, no melena  GU:   no dysuria,  no frequency, no urinary tract infection, no hematuria, no enlarged prostate, no kidney stones, no kidney disease  Vascular:  no pain suggestive of claudication, no pain in feet, no leg cramps, no varicose veins, no DVT, no non-healing foot ulcer  Neuro:   no stroke, no TIA's, no seizures, on headaches, no temporary blindness one eye,  no slurred speech, no peripheral neuropathy, no chronic pain, no instability of gait, no memory/cognitive dysfunction  Musculoskeletal: no arthritis, no joint swelling, no myalgias, no difficulty walking, normal mobility   Skin:   no rash, no itching, no skin infections, no pressure sores or ulcerations  Psych:   no  anxiety, no depression, no nervousness, no unusual recent stress  Eyes:   no blurry vision, no floaters, no recent vision changes, + wears glasses   ENT:   no hearing loss, no loose or painful teeth, no dentures, last saw dentist regularly  Hematologic:  no easy bruising, no abnormal bleeding, no clotting disorder, no frequent epistaxis  Endocrine:  no diabetes, does not check CBG's at home     Physical Exam:   BP (!) 158/89 (BP Location: Right Arm, Patient Position: Sitting, Cuff Size: Normal)   Pulse 81   Resp 20   Ht 5' 8 (1.727 m)   Wt 151 lb (68.5 kg)  SpO2 96% Comment: RA  BMI 22.96 kg/m   General:  well-appearing  HEENT:  Unremarkable, NCAT, PERLA, EOMI  Neck:   no JVD, no bruits, no adenopathy   Chest:   clear to auscultation, symmetrical breath sounds, no wheezes, no rhonchi   CV:   RRR, 3/6 systolic murmur RSB, no diastolic murmur  Abdomen:  soft, non-tender, no masses   Extremities:  warm, well-perfused, pedal pulses palpable, no lower extremity edema  Rectal/GU  Deferred  Neuro:   Grossly non-focal and symmetrical throughout  Skin:   Clean and dry, no rashes, no breakdown  Diagnostic Tests:  Echo and cath films from outside. Reports reviewed.  Narrative & Impression  CLINICAL DATA:  Aortic valve replacement (TAVR), pre-op eval   EXAM: Cardiac TAVR CT   TECHNIQUE: A non-contrast, gated CT scan was obtained with axial slices of 2.5 mm through the heart for aortic valve scoring. A 120 kV retrospective, gated, contrast cardiac scan was obtained. Gantry rotation speed was 230 msec and collimation was 0.63 mm. Nitroglycerin was not given. A delayed scan was obtained to exclude left atrial appendage thrombus. The 3D dataset was reconstructed in systole with motion correction. The 3D data set was reconstructed in 5% intervals of the 0-95% of the R-R cycle. Systolic and diastolic phases were analyzed on a dedicated workstation using MPR, MIP, and VRT modes. The  patient received 100 cc of contrast.   FINDINGS: Aortic Valve:   Tricuspid aortic valve with severely reduced cusp excursion. Severely thickened and severely calcified aortic valve cusps.   AV calcium score: 4361   Virtual Basal Annulus Measurements:   Maximum/Minimum Diameter: 30.4 x 25.6 mm   Perimeter: 88.1 mm   Area:  605 mm2   Mild LVOT calcifications below NCC.   Membranous septal length: 8 mm   Based on these measurements, the annulus would be suitable for a 29 mm Sapien 3 valve. Alternatively, Heart Team can consider 34 mm Evolut valve. Recommend Heart Team discussion for valve selection.   Sinus of Valsalva Measurements:   Non-coronary:  36 mm   Right - coronary:  35 mm   Left - coronary:  35 mm   Sinus of Valsalva Height:   Left: 27 mm   Right: 31.7 mm   Aorta: Normal variant 4 vessel branch pattern of aortic arch with the left vertebral artery arising directly from the aortic arch.   Sinotubular Junction:  32 mm   Ascending Thoracic Aorta:  40 mm   Aortic Arch:  29 mm   Descending Thoracic Aorta:  27 mm   Coronary Artery Height above Annulus:   Left main: 23.6 mm   Right coronary: 23.8 mm   Coronary Arteries: Normal coronary origins. Right dominance. The study was performed without use of NTG and insufficient for plaque evaluation.   Optimum Fluoroscopic Angle for Delivery: LAO 13 CAU 5   OTHER:   Atria: Mild LA dilation   Left atrial appendage: No thrombus.   Mitral valve: Grossly normal, no mitral annular calcifications.   Pulmonary artery: Normal caliber.   Pulmonary veins: Normal anatomy.   IMPRESSION: 1. Tricuspid aortic valve with severely reduced cusp excursion. Severely thickened and severely calcified aortic valve cusps. 2. Aortic valve calcium score: 4361 3. Annulus area: 605 mm2, suitable for 29 mm Sapien 3 valve. Mild LVOT calcifications. Membranous septal length 8 mm. 4. Sufficient coronary artery heights from  annulus. 5. Optimum fluoroscopic angle for delivery: LAO 13 CAU 5   Electronically Signed:  By: Gayatri  Acharya M.D. On: 10/20/2023 10:30   Narrative & Impression  CLINICAL DATA:  Preoperative evaluation, aortic valve replacement   EXAM: CTA ABDOMEN AND PELVIS WITHOUT AND WITH CONTRAST   TECHNIQUE: Multidetector CT imaging of the abdomen and pelvis was performed using the standard protocol during bolus administration of intravenous contrast. Multiplanar reconstructed images and MIPs were obtained and reviewed to evaluate the vascular anatomy.   RADIATION DOSE REDUCTION: This exam was performed according to the departmental dose-optimization program which includes automated exposure control, adjustment of the mA and/or kV according to patient size and/or use of iterative reconstruction technique.   CONTRAST:  OMNIPAQUE  IOHEXOL  350 MG/ML SOLN   COMPARISON:  None Available.   FINDINGS: VASCULAR   Aorta: Patent, mild atherosclerotic changes. Minimal diameter measures 16 mm.   Celiac: Patent. Dilatation of the distal segment estimated at 9.7 mm in diameter.   SMA: Patent.   Renals: The right renal artery demonstrates a beaded morphology with areas of dilatation and narrowing. At the distal interlobar bifurcation, the artery measures 6.7 mm. Similar beading is seen in the distal left main renal artery, but to a lesser extent. There is mild dilatation of an interlobar branch on the left measuring 6.8 mm.   IMA: Patent.   Inflow: The right common iliac artery is patent and ectatic with minimal diameter estimated at 12 mm. The right internal iliac artery is patent. The right external iliac artery is ectatic with minimal diameter estimated at 9 mm.   The left common iliac artery is ectatic. There is a caliber change within the mid segment which is mild on image 175 of CT series 309. Minimal diameter is estimated at 10 mm. The left internal iliac artery is patent.  The left external iliac artery is patent with minimal diameter estimated at 10 mm.   Proximal Outflow: The right common femoral artery is patent and measures 11 mm. The left common femoral artery measures 10 mm.   Veins: No obvious venous abnormality within the limitations of this arterial phase study.   Review of the MIP images confirms the above findings.   NON-VASCULAR   Lower chest: No acute abnormality.   Hepatobiliary: No focal liver abnormality is seen. No gallstones, gallbladder wall thickening, or biliary dilatation.   Pancreas: Unremarkable. No pancreatic ductal dilatation or surrounding inflammatory changes.   Spleen: Normal in size without focal abnormality.   Adrenals/Urinary Tract: Mild nodularity of the adrenal glands. No hydronephrosis or significant nephrolithiasis. The urinary bladder is partially decompressed, but grossly unremarkable.   Stomach/Bowel: Stomach is within normal limits. Appendix appears normal. No evidence of bowel wall thickening, distention, or inflammatory changes. Diverticular changes are present in the colon.   Lymphatic: No significant vascular findings are present. No enlarged abdominal or pelvic lymph nodes.   Reproductive: Enlarged prostate.   Other: Nothing significant.   Musculoskeletal: Degenerative changes are present in the lumbar spine, worst at L5-S1.   IMPRESSION: 1. 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 2. Dilation of the distal celiac artery measuring 9.7 mm. 3. Ectatic iliac arteries with minimal diameters detailed above.     Electronically Signed   By: Maude Naegeli M.D.   On: 10/21/2023 10:42      Impression:  This 73 year old gentleman has stage D, severe, symptomatic aortic stenosis with NYHA class II symptoms of fatigue as well as episodes of intermittent lightheadedness.  I agree that aortic valve replacement is indicated for relief of his symptoms  and to  prevent progressive left ventricular dysfunction.  His outside echo reports a mean gradient of 48 mmHg across the aortic valve with a left ventricular ejection fraction of 55%.  Cardiac catheterization shows a widely patent stent in the LAD with minimal nonobstructive disease elsewhere.  I agree that aortic valve replacement is indicated for relief of his symptoms and to prevent progressive left ventricular dysfunction.  Given his age I think a transcatheter aortic valve replacement be the best option for treating him.  His gated cardiac CTA shows anatomy suitable for TAVR using a 29 mm SAPIEN 3 valve.  His abdominal and pelvic CTA shows adequate pelvic vascular anatomy to allow transfemoral insertion.  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  The patient and his wife were counseled at length regarding treatment alternatives for management of severe symptomatic aortic stenosis. The risks and benefits of surgical intervention has been discussed in detail. Long-term prognosis with medical therapy was discussed. Alternative approaches such as conventional surgical aortic valve replacement, transcatheter aortic valve replacement, and palliative medical therapy were compared and contrasted at length. This discussion was placed in the context of the patient's own specific clinical presentation and past medical history. All of their questions have been addressed.   Following the decision to proceed with transcatheter aortic valve replacement, a discussion was held regarding what types of management strategies would be attempted intraoperatively in the event of life-threatening complications, including whether or not the patient would be considered a candidate for the use of cardiopulmonary bypass and/or conversion to open sternotomy for attempted surgical intervention.  I think he would be a candidate  for emergent sternotomy to manage any intraoperative complications.  The patient has been advised of a variety of complications that might develop including but not limited to risks of death, stroke, paravalvular leak, aortic dissection or other major vascular complications, aortic annulus rupture, device embolization, cardiac rupture or perforation, mitral regurgitation, acute myocardial infarction, arrhythmia, heart block or bradycardia requiring permanent pacemaker placement, congestive heart failure, respiratory failure, renal failure, pneumonia, infection, other late complications related to structural valve deterioration or migration, or other complications that might ultimately cause a temporary or permanent loss of functional independence or other long term morbidity. The patient provides full informed consent for the procedure as described and all questions were answered.      Plan:  He will be scheduled for transfemoral TAVR using a SAPIEN 3 valve on 11/21/2023.  I spent 45 minutes performing this consultation and > 50% of this time was spent face to face counseling and coordinating the care of this patient's severe symptomatic aortic stenosis.   Dorise LOIS Fellers, MD 11/09/2023

## 2023-11-13 ENCOUNTER — Other Ambulatory Visit: Payer: Self-pay

## 2023-11-13 DIAGNOSIS — I350 Nonrheumatic aortic (valve) stenosis: Secondary | ICD-10-CM

## 2023-11-17 ENCOUNTER — Encounter (HOSPITAL_COMMUNITY)
Admission: RE | Admit: 2023-11-17 | Discharge: 2023-11-17 | Disposition: A | Discharge status: Home / Self Care | Source: Ambulatory Visit | Attending: Internal Medicine | Admitting: Internal Medicine

## 2023-11-17 ENCOUNTER — Other Ambulatory Visit: Payer: Self-pay

## 2023-11-17 ENCOUNTER — Ambulatory Visit (HOSPITAL_COMMUNITY)
Admission: RE | Admit: 2023-11-17 | Discharge: 2023-11-17 | Disposition: A | Discharge status: Home / Self Care | Source: Ambulatory Visit | Attending: Internal Medicine | Admitting: Internal Medicine

## 2023-11-17 DIAGNOSIS — Z01818 Encounter for other preprocedural examination: Secondary | ICD-10-CM

## 2023-11-17 DIAGNOSIS — I350 Nonrheumatic aortic (valve) stenosis: Secondary | ICD-10-CM | POA: Insufficient documentation

## 2023-11-17 LAB — CBC
HCT: 40.4 % (ref 39.0–52.0)
Hemoglobin: 13.9 g/dL (ref 13.0–17.0)
MCH: 30.8 pg (ref 26.0–34.0)
MCHC: 34.4 g/dL (ref 30.0–36.0)
MCV: 89.6 fL (ref 80.0–100.0)
Platelets: 224 K/uL (ref 150–400)
RBC: 4.51 MIL/uL (ref 4.22–5.81)
RDW: 12.4 % (ref 11.5–15.5)
WBC: 6 K/uL (ref 4.0–10.5)
nRBC: 0 % (ref 0.0–0.2)

## 2023-11-17 LAB — URINALYSIS, ROUTINE W REFLEX MICROSCOPIC
Bilirubin Urine: NEGATIVE
Glucose, UA: NEGATIVE mg/dL
Hgb urine dipstick: NEGATIVE
Ketones, ur: NEGATIVE mg/dL
Leukocytes,Ua: NEGATIVE
Nitrite: NEGATIVE
Protein, ur: NEGATIVE mg/dL
Specific Gravity, Urine: 1.016 (ref 1.005–1.030)
pH: 5 (ref 5.0–8.0)

## 2023-11-17 LAB — COMPREHENSIVE METABOLIC PANEL WITH GFR
ALT: 25 U/L (ref 0–44)
AST: 28 U/L (ref 15–41)
Albumin: 4.1 g/dL (ref 3.5–5.0)
Alkaline Phosphatase: 43 U/L (ref 38–126)
Anion gap: 8 (ref 5–15)
BUN: 8 mg/dL (ref 8–23)
CO2: 26 mmol/L (ref 22–32)
Calcium: 9.5 mg/dL (ref 8.9–10.3)
Chloride: 105 mmol/L (ref 98–111)
Creatinine, Ser: 0.91 mg/dL (ref 0.61–1.24)
GFR, Estimated: >60 mL/min (ref >60)
Glucose, Bld: 121 mg/dL — ABNORMAL HIGH (ref 70–99)
Potassium: 3.8 mmol/L (ref 3.5–5.1)
Sodium: 139 mmol/L (ref 135–145)
Total Bilirubin: 1.1 mg/dL (ref 0.0–1.2)
Total Protein: 6.8 g/dL (ref 6.5–8.1)

## 2023-11-17 LAB — TYPE AND SCREEN
ABO/RH(D): O POS
Antibody Screen: NEGATIVE

## 2023-11-17 LAB — SURGICAL PCR SCREEN
MRSA, PCR: NEGATIVE
Staphylococcus aureus: NEGATIVE

## 2023-11-17 LAB — PROTIME-INR
INR: 1.1 (ref 0.8–1.2)
Prothrombin Time: 14.9 s (ref 11.4–15.2)

## 2023-11-17 NOTE — Progress Notes (Signed)
 Patient signed all consents at PAT lab appointment. CHG soap and instructions were given to patient. CHG surgical prep reviewed with patient and all questions answered.  Patients chart send to anesthesia for review. Pt denies any respiratory illness/infection in the last two months.

## 2023-11-20 MED ORDER — POTASSIUM CHLORIDE 2 MEQ/ML IV SOLN
80.0000 meq | INTRAVENOUS | Status: DC
  Filled 2023-11-20: qty 40

## 2023-11-20 MED ORDER — MAGNESIUM SULFATE 50 % IJ SOLN
40.0000 meq | INTRAMUSCULAR | Status: DC
  Filled 2023-11-20: qty 9.85

## 2023-11-20 MED ORDER — CEFAZOLIN SODIUM-DEXTROSE 2-4 GM/100ML-% IV SOLN
2.0000 g | INTRAVENOUS | Status: AC
  Administered 2023-11-21: 2 g via INTRAVENOUS
  Filled 2023-11-20: qty 100

## 2023-11-20 MED ORDER — DEXMEDETOMIDINE HCL IN NACL 400 MCG/100ML IV SOLN
0.1000 ug/kg/h | INTRAVENOUS | Status: AC
  Administered 2023-11-21: 1 ug/kg/h via INTRAVENOUS
  Administered 2023-11-21: 68.52 ug via INTRAVENOUS
  Filled 2023-11-20: qty 100

## 2023-11-20 MED ORDER — NOREPINEPHRINE 4 MG/250ML-% IV SOLN
0.0000 ug/min | INTRAVENOUS | Status: AC
  Administered 2023-11-21: 1 ug/min via INTRAVENOUS
  Filled 2023-11-20: qty 250

## 2023-11-20 MED ORDER — HEPARIN 30,000 UNITS/1000 ML (OHS) CELLSAVER SOLUTION
Status: DC
  Filled 2023-11-20: qty 1000

## 2023-11-20 NOTE — H&P (Signed)
 Patient ID: Carlos Simon, male   DOB: 09-Jul-1950, 73 y.o.   MRN: [id number]    [facility]   MULTIDISCIPLINARY HE[facility] VALVE CLINIC     CARDIOTHORACIC SURGERY ADMISSION HISTORY AND PHYSICAL   PCP is [facility] Referring Provider is Lurena Red, MD Primary Cardiologist is Dr. 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  He is cared for in the [place] system and a recent echocardiogram showed severe aortic stenosis with a mean gradient of 48 mmHg and a peak velocity near 5 m/s.  Cardiac catheterization in 08/2023 showed a widely patent LAD stent with otherwise minimal nonobstructive disease.  He reports a several month history of intermittent lightheadedness that has occurred with change in position as well as turning quickly.  He has had no syncope.  He denies any chest pain or pressure.  He said no shortness of breath.       Past Medical History:  Diagnosis Date   Aortic stenosis     Asthma     Seasonal allergies                 Past Surgical History:  Procedure Laterality Date   laminectomies       X-STOP IMPLANTATION                   Family History  Problem Relation Age of Onset   Aortic stenosis Mother     Irregular heart beat Mother            Social History         Socioeconomic History   Marital status: Married      Spouse name: Not on file   Number of children: Not on file   Years of education: Not on file   Highest education level: Not on file  Occupational History   Occupation: Retired Education officer, community  Tobacco Use   Smoking status: Never   Smokeless tobacco: Not on file  Substance and Sexual Activity   Alcohol use: Yes      Alcohol/week: 1.0 - 2.0 standard drink of alcohol       Types: 1 - 2 Standard drinks or equivalent per week   Drug use: Not on file   Sexual activity: Not on file  Other Topics Concern   Not on file  Social History Narrative   Not on file    Social Drivers of Health        Financial Resource Strain: Not on file  Food Insecurity: Low Risk  (05/09/2022)    Received from [facility]    Hunger Vital Sign     Within the past 12 months, you worried that your food would run out before you got money to buy more: Never true     Within the past 12 months, the food you bought just didn't last and you didn't have money to get more: Not on file  Transportation Needs: No Transportation Needs (05/09/2022)    Received from [employer]     In the past 12 months, has lack of reliable transportation kept you from medical appointments, meetings, work or from getting things needed for daily living? : No  Physical Activity: Not on  file  Stress: Not on file  Social Connections: Not on file  Intimate Partner Violence: Low Risk  (05/09/2022)    Received from [facility] visits prior to 07/02/2022.    Safety     How often does anyone, including family and friends, physically hurt you?: Never     How often does anyone, including family and friends, insult or talk down to you?: Never     How often does anyone, including family and friends, threaten you with harm?: Never     How often does anyone, including family and friends, scream or curse at you?: Never             Prior to Admission medications   Medication Sig Start Date End Date Taking? Authorizing Provider  aspirin  81 MG tablet Take 81 mg by mouth daily.     Yes [provider]  atorvastatin (LIPITOR) 40 MG tablet Take 40 mg by mouth daily.     Yes [provider]  HYDROPHILIC EX Apply topically.     Yes [provider]  metoprolol  tartrate (LOPRESSOR ) 50 MG tablet Take one tablet by mouth two hours before the CT scan 10/06/23   Yes Thukkani,  Arun K, MD  psyllium (METAMUCIL) 58.6 % powder Take 1 packet by mouth 3 (three) times daily.     Yes [provider]            Current Outpatient Medications  Medication Sig Dispense Refill   aspirin  81 MG tablet Take 81 mg by mouth daily.       atorvastatin (LIPITOR) 40 MG tablet Take 40 mg by mouth daily.       HYDROPHILIC EX Apply topically.       metoprolol  tartrate (LOPRESSOR ) 50 MG tablet Take one tablet by mouth two hours before the CT scan 1 tablet 0   psyllium (METAMUCIL) 58.6 % powder Take 1 packet by mouth 3 (three) times daily.          No current facility-administered medications for this visit.        Allergies  No Known Allergies         Review of Systems:               General:                      normal appetite, + decreased energy, no weight gain, no weight loss, no fever             Cardiac:                       no chest pain with exertion, no chest pain at rest, no SOB with  exertion, no resting SOB, no PND, no orthopnea, no palpitations, no arrhythmia, no atrial fibrillation, no LE edema, + dizzy spells, no syncope             Respiratory:                 no shortness of breath, no home oxygen, no productive cough, no dry cough, no bronchitis, no wheezing, no hemoptysis, no asthma, no pain with inspiration or cough, no sleep apnea, no CPAP at night             GI:  no difficulty swallowing, no reflux, no frequent heartburn, no hiatal hernia, no abdominal pain, no constipation, no diarrhea, no hematochezia, no hematemesis, no melena             GU:                              no dysuria,  no frequency, no urinary tract infection, no hematuria, no enlarged prostate, no kidney stones, no kidney disease             Vascular:                     no pain suggestive of claudication, no pain in feet, no leg cramps, no varicose veins, no DVT, no non-healing foot ulcer             Neuro:                         no stroke, no TIA's,  no seizures, on headaches, no temporary blindness one eye,  no slurred speech, no peripheral neuropathy, no chronic pain, no instability of gait, no memory/cognitive dysfunction             Musculoskeletal:         no arthritis, no joint swelling, no myalgias, no difficulty walking, normal mobility              Skin:                            no rash, no itching, no skin infections, no pressure sores or ulcerations             Psych:                         no anxiety, no depression, no nervousness, no unusual recent stress             Eyes:                           no blurry vision, no floaters, no recent vision changes, + wears glasses              ENT:                            no hearing loss, no loose or painful teeth, no dentures, last saw dentist regularly             Hematologic:               no easy bruising, no abnormal bleeding, no clotting disorder, no frequent epistaxis             Endocrine:                   no diabetes, does not check CBG's at home                            Physical Exam:               BP (!) 158/89 (BP Location: Right Arm, Patient Position: Sitting, Cuff Size: Normal)   Pulse 81   Resp 20   Ht 5' 8 (1.727 m)   Wt 151  lb (68.5 kg)   SpO2 96% Comment: RA  BMI 22.96 kg/m              General:                      well-appearing             HEENT:                       Unremarkable, NCAT, PERLA, EOMI             Neck:                           no JVD, no bruits, no adenopathy              Chest:                          clear to auscultation, symmetrical breath sounds, no wheezes, no rhonchi              CV:                              RRR, 3/6 systolic murmur RSB, no diastolic murmur             Abdomen:                    soft, non-tender, no masses              Extremities:                 warm, well-perfused, pedal pulses palpable, no lower extremity edema             Rectal/GU                   Deferred             Neuro:                          Grossly non-focal and symmetrical throughout             Skin:                            Clean and dry, no rashes, no breakdown   Diagnostic Tests:   Echo and cath films from outside. Reports reviewed.   Narrative & Impression  CLINICAL DATA:  Aortic valve replacement (TAVR), pre-op eval   EXAM: Cardiac TAVR CT   TECHNIQUE: A non-contrast, gated CT scan was obtained with axial slices of 2.5 mm through the heart for aortic valve scoring. A 120 kV retrospective, gated, contrast cardiac scan was obtained. Gantry rotation speed was 230 msec and collimation was 0.63 mm. Nitroglycerin  was not given. A delayed scan was obtained to exclude left atrial appendage thrombus. The 3D dataset was reconstructed in systole with motion correction. The 3D data set was reconstructed in 5% intervals of the 0-95% of the R-R cycle. Systolic and diastolic phases were analyzed on a dedicated workstation using MPR, MIP, and VRT modes. The patient received 100 cc of contrast.   FINDINGS: Aortic Valve:   Tricuspid aortic valve with severely reduced cusp excursion. Severely thickened and severely calcified aortic valve cusps.   AV calcium  score: 4361   Virtual Basal Annulus Measurements:   Maximum/Minimum Diameter: 30.4 x 25.6 mm   Perimeter: 88.1 mm   Area:  605 mm2   Mild LVOT calcifications below NCC.   Membranous septal length: 8 mm   Based on these measurements, the annulus would be suitable for a 29 mm Sapien 3 valve. Alternatively, Heart Team can consider 34 mm Evolut valve. Recommend Heart Team discussion for valve selection.   Sinus of Valsalva Measurements:   Non-coronary:  36 mm   Right - coronary:  35 mm   Left - coronary:  35 mm   Sinus of Valsalva Height:   Left: 27 mm   Right: 31.7 mm   Aorta: Normal variant 4 vessel branch pattern of aortic arch with the left vertebral artery arising directly from the aortic arch.   Sinotubular Junction:  32 mm   Ascending  Thoracic Aorta:  40 mm   Aortic Arch:  29 mm   Descending Thoracic Aorta:  27 mm   Coronary Artery Height above Annulus:   Left main: 23.6 mm   Right coronary: 23.8 mm   Coronary Arteries: Normal coronary origins. Right dominance. The study was performed without use of NTG and insufficient for plaque evaluation.   Optimum Fluoroscopic Angle for Delivery: LAO 13 CAU 5   OTHER:   Atria: Mild LA dilation   Left atrial appendage: No thrombus.   Mitral valve: Grossly normal, no mitral annular calcifications.   Pulmonary artery: Normal caliber.   Pulmonary veins: Normal anatomy.   IMPRESSION: 1. Tricuspid aortic valve with severely reduced cusp excursion. Severely thickened and severely calcified aortic valve cusps. 2. Aortic valve calcium score: 4361 3. Annulus area: 605 mm2, suitable for 29 mm Sapien 3 valve. Mild LVOT calcifications. Membranous septal length 8 mm. 4. Sufficient coronary artery heights from annulus. 5. Optimum fluoroscopic angle for delivery: LAO 13 CAU 5   Electronically Signed: By: Soyla Merck M.D. On: 10/20/2023 10:30    Narrative & Impression  CLINICAL DATA:  Preoperative evaluation, aortic valve replacement   EXAM: CTA ABDOMEN AND PELVIS WITHOUT AND WITH CONTRAST   TECHNIQUE: Multidetector CT imaging of the abdomen and pelvis was performed using the standard protocol during bolus administration of intravenous contrast. Multiplanar reconstructed images and MIPs were obtained and reviewed to evaluate the vascular anatomy.   RADIATION DOSE REDUCTION: This exam was performed according to the departmental dose-optimization program which includes automated exposure control, adjustment of the mA and/or kV according to patient size and/or use of iterative reconstruction technique.   CONTRAST:  OMNIPAQUE  IOHEXOL  350 MG/ML SOLN   COMPARISON:  None Available.   FINDINGS: VASCULAR   Aorta: Patent, mild atherosclerotic changes.  Minimal diameter measures 16 mm.   Celiac: Patent. Dilatation of the distal segment estimated at 9.7 mm in diameter.   SMA: Patent.   Renals: The right renal artery demonstrates a beaded morphology with areas of dilatation and narrowing. At the distal interlobar bifurcation, the artery measures 6.7 mm. Similar beading is seen in the distal left main renal artery, but to a lesser extent. There is mild dilatation of an interlobar branch on the left measuring 6.8 mm.   IMA: Patent.   Inflow: The right common iliac artery is patent and ectatic with minimal diameter estimated at 12 mm. The right internal iliac artery is patent. The right external iliac artery is ectatic with minimal diameter estimated at 9 mm.   The left common iliac artery is ectatic. There  is a caliber change within the mid segment which is mild on image 175 of CT series 309. Minimal diameter is estimated at 10 mm. The left internal iliac artery is patent. The left external iliac artery is patent with minimal diameter estimated at 10 mm.   Proximal Outflow: The right common femoral artery is patent and measures 11 mm. The left common femoral artery measures 10 mm.   Veins: No obvious venous abnormality within the limitations of this arterial phase study.   Review of the MIP images confirms the above findings.   NON-VASCULAR   Lower chest: No acute abnormality.   Hepatobiliary: No focal liver abnormality is seen. No gallstones, gallbladder wall thickening, or biliary dilatation.   Pancreas: Unremarkable. No pancreatic ductal dilatation or surrounding inflammatory changes.   Spleen: Normal in size without focal abnormality.   Adrenals/Urinary Tract: Mild nodularity of the adrenal glands. No hydronephrosis or significant nephrolithiasis. The urinary bladder is partially decompressed, but grossly unremarkable.   Stomach/Bowel: Stomach is within normal limits. Appendix appears normal. No evidence of bowel  wall thickening, distention, or inflammatory changes. Diverticular changes are present in the colon.   Lymphatic: No significant vascular findings are present. No enlarged abdominal or pelvic lymph nodes.   Reproductive: Enlarged prostate.   Other: Nothing significant.   Musculoskeletal: Degenerative changes are present in the lumbar spine, worst at L5-S1.   IMPRESSION: 1. 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 2. Dilation of the distal celiac artery measuring 9.7 mm. 3. Ectatic iliac arteries with minimal diameters detailed above.     Electronically Signed   By: Maude Naegeli M.D.   On: 10/21/2023 10:42        Impression:   This 73 year old gentleman has stage D, severe, symptomatic aortic stenosis with NYHA class II symptoms of fatigue as well as episodes of intermittent lightheadedness.  I agree that aortic valve replacement is indicated for relief of his symptoms and to prevent progressive left ventricular dysfunction.  His outside echo reports a mean gradient of 48 mmHg across the aortic valve with a left ventricular ejection fraction of 55%.  Cardiac catheterization shows a widely patent stent in the LAD with minimal nonobstructive disease elsewhere.  I agree that aortic valve replacement is indicated for relief of his symptoms and to prevent progressive left ventricular dysfunction.  Given his age I think a transcatheter aortic valve replacement be the best option for treating him.  His gated cardiac CTA shows anatomy suitable for TAVR using a 29 mm SAPIEN 3 valve.  His abdominal and pelvic CTA shows adequate pelvic vascular anatomy to allow transfemoral insertion.  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   The patient and his wife were counseled at length regarding treatment alternatives for management of severe  symptomatic aortic stenosis. The risks and benefits of surgical intervention has been discussed in detail. Long-term prognosis with medical therapy was discussed. Alternative approaches such as conventional surgical aortic valve replacement, transcatheter aortic valve replacement, and palliative medical therapy were compared and contrasted at length. This discussion was placed in the context of the patient's own specific clinical presentation and past medical history. All of their questions have been addressed.    Following the decision to proceed with transcatheter aortic valve replacement, a discussion was held regarding what types of management strategies would be attempted intraoperatively in the event of life-threatening  complications, including whether or not the patient would be considered a candidate for the use of cardiopulmonary bypass and/or conversion to open sternotomy for attempted surgical intervention.  I think he would be a candidate for emergent sternotomy to manage any intraoperative complications.  The patient has been advised of a variety of complications that might develop including but not limited to risks of death, stroke, paravalvular leak, aortic dissection or other major vascular complications, aortic annulus rupture, device embolization, cardiac rupture or perforation, mitral regurgitation, acute myocardial infarction, arrhythmia, heart block or bradycardia requiring permanent pacemaker placement, congestive heart failure, respiratory failure, renal failure, pneumonia, infection, other late complications related to structural valve deterioration or migration, or other complications that might ultimately cause a temporary or permanent loss of functional independence or other long term morbidity. The patient provides full informed consent for the procedure as described and all questions were answered.       Plan:   Transfemoral TAVR using a SAPIEN 3 valve.    Dorise LOIS Fellers, MD

## 2023-11-21 ENCOUNTER — Other Ambulatory Visit: Payer: Self-pay

## 2023-11-21 ENCOUNTER — Encounter (HOSPITAL_COMMUNITY)
Admission: RE | Disposition: A | Discharge status: Home / Self Care | Source: Home / Self Care | Attending: Internal Medicine

## 2023-11-21 ENCOUNTER — Inpatient Hospital Stay (HOSPITAL_COMMUNITY)
Admission: RE | Admit: 2023-11-21 | Discharge: 2023-11-22 | DRG: 267 | Disposition: A | Discharge status: Home / Self Care | Attending: Internal Medicine | Admitting: Internal Medicine

## 2023-11-21 ENCOUNTER — Encounter (HOSPITAL_COMMUNITY): Payer: Self-pay | Admitting: Internal Medicine

## 2023-11-21 ENCOUNTER — Inpatient Hospital Stay (HOSPITAL_COMMUNITY)

## 2023-11-21 ENCOUNTER — Inpatient Hospital Stay (HOSPITAL_COMMUNITY): Admitting: Certified Registered Nurse Anesthetist

## 2023-11-21 DIAGNOSIS — I350 Nonrheumatic aortic (valve) stenosis: Secondary | ICD-10-CM

## 2023-11-21 DIAGNOSIS — Z006 Encounter for examination for normal comparison and control in clinical research program: Secondary | ICD-10-CM

## 2023-11-21 DIAGNOSIS — E785 Hyperlipidemia, unspecified: Secondary | ICD-10-CM | POA: Diagnosis present

## 2023-11-21 DIAGNOSIS — Z7982 Long term (current) use of aspirin: Secondary | ICD-10-CM

## 2023-11-21 DIAGNOSIS — Z952 Presence of prosthetic heart valve: Secondary | ICD-10-CM

## 2023-11-21 DIAGNOSIS — Z79899 Other long term (current) drug therapy: Secondary | ICD-10-CM

## 2023-11-21 DIAGNOSIS — Z955 Presence of coronary angioplasty implant and graft: Secondary | ICD-10-CM | POA: Diagnosis not present

## 2023-11-21 DIAGNOSIS — J45909 Unspecified asthma, uncomplicated: Secondary | ICD-10-CM | POA: Diagnosis not present

## 2023-11-21 DIAGNOSIS — I452 Bifascicular block: Secondary | ICD-10-CM | POA: Diagnosis present

## 2023-11-21 DIAGNOSIS — I447 Left bundle-branch block, unspecified: Principal | ICD-10-CM

## 2023-11-21 DIAGNOSIS — I2510 Atherosclerotic heart disease of native coronary artery without angina pectoris: Secondary | ICD-10-CM | POA: Diagnosis present

## 2023-11-21 HISTORY — PX: INTRAOPERATIVE TRANSTHORACIC ECHOCARDIOGRAM: SHX6523

## 2023-11-21 LAB — POCT I-STAT, CHEM 8
BUN: 7 mg/dL — ABNORMAL LOW (ref 8–23)
Calcium, Ion: 1.25 mmol/L (ref 1.15–1.40)
Chloride: 102 mmol/L (ref 98–111)
Creatinine, Ser: 0.6 mg/dL — ABNORMAL LOW (ref 0.61–1.24)
Glucose, Bld: 115 mg/dL — ABNORMAL HIGH (ref 70–99)
HCT: 34 % — ABNORMAL LOW (ref 39.0–52.0)
Hemoglobin: 11.6 g/dL — ABNORMAL LOW (ref 13.0–17.0)
Potassium: 3.7 mmol/L (ref 3.5–5.1)
Sodium: 138 mmol/L (ref 135–145)
TCO2: 24 mmol/L (ref 22–32)

## 2023-11-21 LAB — ECHOCARDIOGRAM LIMITED
AR max vel: 2.52 cm2
AV Area VTI: 2.52 cm2
AV Area mean vel: 2.58 cm2
AV Mean grad: 6 mmHg
AV Peak grad: 9.2 mmHg
Ao pk vel: 1.52 m/s
Area-P 1/2: 2.29 cm2
S' Lateral: 2.6 cm

## 2023-11-21 LAB — ABO/RH: ABO/RH(D): O POS

## 2023-11-21 MED ORDER — NITROGLYCERIN IN D5W 200-5 MCG/ML-% IV SOLN: 0.0000 ug/min | INTRAVENOUS | Status: DC

## 2023-11-21 MED ORDER — FENTANYL CITRATE (PF) 100 MCG/2ML IJ SOLN
INTRAMUSCULAR | Status: AC
  Filled 2023-11-21: qty 2

## 2023-11-21 MED ORDER — HEPARIN SODIUM (PORCINE) 1000 UNIT/ML IJ SOLN
INTRAMUSCULAR | Status: DC | PRN
  Administered 2023-11-21: 10000 [IU] via INTRAVENOUS

## 2023-11-21 MED ORDER — OXYCODONE HCL 5 MG PO TABS: 5.0000 mg | ORAL_TABLET | Freq: Once | ORAL | Status: DC | PRN

## 2023-11-21 MED ORDER — MIDAZOLAM HCL 5 MG/5ML IJ SOLN
INTRAMUSCULAR | Status: DC | PRN
  Administered 2023-11-21 (×2): 1 mg via INTRAVENOUS

## 2023-11-21 MED ORDER — CHLORHEXIDINE GLUCONATE 4 % EX SOLN: 60.0000 mL | Freq: Once | CUTANEOUS | Status: DC

## 2023-11-21 MED ORDER — ONDANSETRON HCL 4 MG/2ML IJ SOLN: 4.0000 mg | Freq: Four times a day (QID) | INTRAMUSCULAR | Status: DC | PRN

## 2023-11-21 MED ORDER — CHLORHEXIDINE GLUCONATE 4 % EX SOLN: 30.0000 mL | CUTANEOUS | Status: DC

## 2023-11-21 MED ORDER — PROTAMINE SULFATE 10 MG/ML IV SOLN
INTRAVENOUS | Status: DC | PRN
  Administered 2023-11-21: 100 mg via INTRAVENOUS

## 2023-11-21 MED ORDER — LIDOCAINE HCL (PF) 1 % IJ SOLN
INTRAMUSCULAR | Status: AC
  Filled 2023-11-21: qty 30

## 2023-11-21 MED ORDER — SODIUM CHLORIDE 0.9 % IV SOLN: INTRAVENOUS | Status: AC

## 2023-11-21 MED ORDER — CHLORHEXIDINE GLUCONATE 0.12 % MT SOLN
15.0000 mL | Freq: Once | OROMUCOSAL | Status: AC
  Administered 2023-11-21: 15 mL via OROMUCOSAL
  Filled 2023-11-21: qty 15

## 2023-11-21 MED ORDER — SODIUM CHLORIDE 0.9 % IV SOLN: INTRAVENOUS | Status: DC

## 2023-11-21 MED ORDER — FENTANYL CITRATE (PF) 100 MCG/2ML IJ SOLN
INTRAMUSCULAR | Status: DC | PRN
  Administered 2023-11-21: 25 ug via INTRAVENOUS
  Administered 2023-11-21: 50 ug via INTRAVENOUS

## 2023-11-21 MED ORDER — CLOPIDOGREL BISULFATE 75 MG PO TABS: 75.0000 mg | ORAL_TABLET | Freq: Every day | ORAL | Status: DC

## 2023-11-21 MED ORDER — SODIUM CHLORIDE 0.9% FLUSH
3.0000 mL | Freq: Two times a day (BID) | INTRAVENOUS | Status: DC
  Administered 2023-11-21 – 2023-11-22 (×2): 3 mL via INTRAVENOUS

## 2023-11-21 MED ORDER — NOREPINEPHRINE BITARTRATE 1 MG/ML IV SOLN
INTRAVENOUS | Status: DC | PRN
  Administered 2023-11-21 (×2): 1 mL via INTRAVENOUS
  Administered 2023-11-21: .5 mL via INTRAVENOUS
  Administered 2023-11-21 (×2): 1 mL via INTRAVENOUS
  Administered 2023-11-21: 2 mL via INTRAVENOUS
  Administered 2023-11-21: .5 mL via INTRAVENOUS
  Administered 2023-11-21 (×5): 1 mL via INTRAVENOUS

## 2023-11-21 MED ORDER — OXYCODONE HCL 5 MG PO TABS
5.0000 mg | ORAL_TABLET | ORAL | Status: DC | PRN
  Administered 2023-11-21: 5 mg via ORAL
  Filled 2023-11-21: qty 1

## 2023-11-21 MED ORDER — ACETAMINOPHEN 650 MG RE SUPP: 650.0000 mg | Freq: Four times a day (QID) | RECTAL | Status: DC | PRN

## 2023-11-21 MED ORDER — METOPROLOL TARTRATE 5 MG/5ML IV SOLN: 2.5000 mg | INTRAVENOUS | Status: DC | PRN

## 2023-11-21 MED ORDER — LIDOCAINE HCL (PF) 1 % IJ SOLN
INTRAMUSCULAR | Status: DC | PRN
  Administered 2023-11-21 (×2): 5 mL

## 2023-11-21 MED ORDER — FENTANYL CITRATE (PF) 100 MCG/2ML IJ SOLN: 25.0000 ug | INTRAMUSCULAR | Status: DC | PRN

## 2023-11-21 MED ORDER — MIDAZOLAM HCL 2 MG/2ML IJ SOLN
INTRAMUSCULAR | Status: AC
  Filled 2023-11-21: qty 2

## 2023-11-21 MED ORDER — OXYCODONE HCL 5 MG/5ML PO SOLN: 5.0000 mg | Freq: Once | ORAL | Status: DC | PRN

## 2023-11-21 MED ORDER — PROPOFOL 500 MG/50ML IV EMUL
INTRAVENOUS | Status: DC | PRN
Start: 2023-11-21 — End: 2023-11-21
  Administered 2023-11-21: 20 ug/kg/min via INTRAVENOUS

## 2023-11-21 MED ORDER — MORPHINE SULFATE (PF) 2 MG/ML IV SOLN: 1.0000 mg | INTRAVENOUS | Status: DC | PRN

## 2023-11-21 MED ORDER — CEFAZOLIN SODIUM-DEXTROSE 2-4 GM/100ML-% IV SOLN
2.0000 g | Freq: Three times a day (TID) | INTRAVENOUS | Status: AC
  Administered 2023-11-21 (×2): 2 g via INTRAVENOUS
  Filled 2023-11-21 (×2): qty 100

## 2023-11-21 MED ORDER — TRAMADOL HCL 50 MG PO TABS: 50.0000 mg | ORAL_TABLET | ORAL | Status: DC | PRN

## 2023-11-21 MED ORDER — LACTATED RINGERS IV SOLN: INTRAVENOUS | Status: DC

## 2023-11-21 MED ORDER — ASPIRIN 81 MG PO CHEW
81.0000 mg | CHEWABLE_TABLET | Freq: Every day | ORAL | Status: DC
  Administered 2023-11-22: 81 mg via ORAL
  Filled 2023-11-21: qty 1

## 2023-11-21 MED ORDER — IODIXANOL 320 MG/ML IV SOLN
INTRAVENOUS | Status: DC | PRN
Start: 2023-11-21 — End: 2023-11-21
  Administered 2023-11-21: 65 mL via INTRA_ARTERIAL

## 2023-11-21 MED ORDER — SODIUM CHLORIDE 0.9% FLUSH: 3.0000 mL | INTRAVENOUS | Status: DC | PRN

## 2023-11-21 MED ORDER — CLEVIDIPINE BUTYRATE 0.5 MG/ML IV EMUL
INTRAVENOUS | Status: DC | PRN
  Administered 2023-11-21: 1 mg/h via INTRAVENOUS

## 2023-11-21 MED ORDER — SODIUM CHLORIDE 0.9 % IV SOLN: 250.0000 mL | INTRAVENOUS | Status: DC | PRN

## 2023-11-21 MED ORDER — HEPARIN (PORCINE) IN NACL 1000-0.9 UT/500ML-% IV SOLN
INTRAVENOUS | Status: DC | PRN
  Administered 2023-11-21 (×3): 500 mL via INTRA_ARTERIAL

## 2023-11-21 MED ORDER — ACETAMINOPHEN 325 MG PO TABS: 650.0000 mg | ORAL_TABLET | Freq: Four times a day (QID) | ORAL | Status: DC | PRN

## 2023-11-21 NOTE — Progress Notes (Signed)
 Right internal jugular sheath removed. Direct pressure held for about 5 min. Level 0. Occlusive dressing placed transparent cover placed.

## 2023-11-21 NOTE — Op Note (Signed)
 [facility]  TAVR OPERATIVE NOTE   Date of Procedure:  11/21/2023  Preoperative Diagnosis: Severe Aortic Stenosis   Postoperative Diagnosis: Same   Procedure:   Transcatheter Aortic Valve Replacement - Transfemoral Approach  Edwards Sapien 3 Resilia THV (size 29 mm, model # 9755RLS, serial # [id number] )   Co-Surgeons:   Dorise LOIS Fellers, MD and Lurena Red, MD Anesthesiologist:  Hodierne   Echocardiographer:  Santo  Pre-operative Echo Findings: Severe aortic stenosis Normal left ventricular systolic function  Post-operative Echo Findings: No paravalvular leak Normal left ventricular systolic function  Left Heart Catheterization Findings: Left ventricular end-diastolic pressure of   BRIEF CLINICAL NOTE AND INDICATIONS FOR SURGERY  The patient is a 73 year old male with a history of hyperlipidemia, coronary artery disease status post PCI of the proximal LAD in 2024, bifascicular block, and severe symptomatic aortic stenosis who was referred for a 29 mm SAPIEN 3 valve from the left transfemoral approach.  During the course of the patient's preoperative work up they have been evaluated comprehensively by a multidisciplinary team of specialists coordinated through the Multidisciplinary Heart Valve Clinic in the [facility].  They have been demonstrated to suffer from symptomatic severe aortic stenosis as noted above. The patient has been counseled extensively as to the relative risks and benefits of all options for the treatment of severe aortic stenosis including long term medical therapy, conventional surgery for aortic valve replacement, and transcatheter aortic valve replacement.  The patient has been independently evaluated by Dr. Fellers with CT surgery and they are felt to be at high risk for conventional surgical aortic valve replacement. The surgeon indicated the patient would be a poor candidate for conventional surgery.  Based upon review of all of the patient's preoperative diagnostic tests they are felt to be candidate for transcatheter aortic valve replacement using the transfemoral approach as an alternative to high risk conventional surgery.    Following the decision to proceed with transcatheter aortic valve replacement, a discussion has been held regarding what types of management strategies would be attempted intraoperatively in the event of life-threatening complications, including whether or not the patient would be considered a candidate for the use of cardiopulmonary bypass and/or conversion to open sternotomy for attempted surgical intervention.  The patient has been advised of a variety of complications that might develop peculiar to this approach including but not limited to risks of death, stroke, paravalvular leak, aortic dissection or other major vascular complications, aortic annulus rupture, device embolization, cardiac rupture or perforation, acute myocardial infarction, arrhythmia, heart block or bradycardia requiring permanent pacemaker placement, congestive heart failure, respiratory failure, renal failure, pneumonia, infection, other late complications related to structural valve deterioration or migration, or other complications that might ultimately cause a temporary or permanent loss of functional independence or other long term morbidity.  The patient provides full informed consent for the procedure as described and all questions were answered preoperatively.    DETAILS OF THE OPERATIVE PROCEDURE  PREPARATION:   The patient is brought to the operating room on the above mentioned date and central monitoring was established by the anesthesia team. The patient is placed in the supine position on the operating table.  Intravenous antibiotics are administered. Conscious sedation is used.   Baseline transthoracic echocardiogram was performed. The patient's chest, abdomen, both groins, and both lower  extremities are prepared and draped in a sterile manner. A time out procedure is performed.   PERIPHERAL ACCESS:   Using the  modified Seldinger technique, femoral arterial and venous access were obtained with placement of a 6 Fr sheath in the righ common femoral artery and a 6 Fr sheath in the right femoral vein using u/s guidance.  A 6 French sheath was also placed in the right internal jugular vein.  A pigtail diagnostic catheter was passed through the femoral arterial sheath under fluoroscopic guidance into the aortic root.  A temporary transvenous pacemaker catheter was passed through the jugular venous sheath under fluoroscopic guidance into the right ventricle.  The pacemaker was tested to ensure stable lead placement and pacemaker capture. Aortic root angiography was performed in order to determine the optimal angiographic angle for valve deployment.  TRANSFEMORAL ACCESS:  A micropuncture kit was used to gain access to the left common femoral artery using u/s guidance. Position confirmed with angiography. Pre-closure with double ProGlide closure devices. The patient was heparinized systemically and ACT verified > 250 seconds.    A 16 Fr transfemoral E-sheath was introduced into the left common femoral artery after progressively dilating over an Amplatz superstiff wire. An AL-1 catheter was used to direct a straight-tip exchange length wire across the native aortic valve into the left ventricle. This was exchanged out for a pigtail catheter and position was confirmed in the LV apex. Simultaneous left ventricular, aortic, and left ventricular end-diastolic pressures were recorded.  The pigtail catheter was then exchanged for an Safari wire in the LV apex.  Direct LV pacing thresholds were assessed and found to be adequate.   BALLOON AORTIC VALVULOPLASTY: A 12mm balloon was used to perform a balloon aortic valvuloplasty which was performed without complications.  TRANSCATHETER HEART VALVE  DEPLOYMENT:  An Edwards Sapien 3 THV (size 29 mm) was prepared and crimped per manufacturer's guidelines, and the proper orientation of the valve is confirmed on the [employer] delivery system. The valve was advanced through the introducer sheath using normal technique until in an appropriate position in the abdominal aorta beyond the sheath tip. The balloon was then retracted and using the fine-tuning wheel was centered on the valve. The valve was then advanced across the aortic arch using appropriate flexion of the catheter. The valve was carefully positioned across the aortic valve annulus. The Commander catheter was retracted using normal technique. Once final position of the valve has been confirmed by angiographic assessment, the valve is deployed while temporarily holding ventilation and during rapid ventricular pacing to maintain systolic blood pressure < 50 mmHg and pulse pressure < 10 mmHg. The balloon inflation is held for >3 seconds after reaching full deployment volume. Once the balloon has fully deflated the balloon is retracted into the ascending aorta and valve function is assessed using TTE. There is felt to be no paravalvular leak and no central aortic insufficiency.  The patient's hemodynamic recovery following valve deployment is good.  The deployment balloon and guidewire are both removed. Echo demostrated acceptable post-procedural gradients, stable mitral valve function, and no AI.   PROCEDURE COMPLETION:  The sheath was then removed and closure devices were completed. Protamine  was administered once femoral arterial repair was complete. The temporary pacemaker, pigtail catheters and femoral sheaths were removed with a Mynx closure device placed in the artery and manual pressure used for venous hemostasis.    The patient tolerated the procedure well and is transported to the surgical intensive care in stable condition. There were no immediate intraoperative complications. All  sponge instrument and needle counts are verified correct at completion of the operation.   No  blood products were administered during the operation.  The patient received a total of 65 mL of intravenous contrast during the procedure.  Foster Sonnier K Neythan Kozlov MD 11/21/2023 11:52 AM

## 2023-11-21 NOTE — Op Note (Signed)
 [facility]   MULTIDISCIPLINARY HEART VALVE TEAM   TAVR OPERATIVE NOTE   Date of Procedure:  11/21/2023  Preoperative Diagnosis: Severe Aortic Stenosis   Postoperative Diagnosis: Same   Procedure:   Transcatheter Aortic Valve Replacement - Percutaneous Left Transfemoral Approach  Edwards Sapien 3 Ultra Resilia THV (size 29 mm, model # 9755RSL, serial # [id number])   Co-Surgeons:  Dorise LOIS Fellers, MD and Lurena Red, MD   Anesthesiologist:  A. Hodierne, MD  Echocardiographer:  EMERSON Leavens, MD  Pre-operative Echo Findings: Severe aortic stenosis Normal left ventricular systolic function  Post-operative Echo Findings: No paravalvular leak Normal left ventricular systolic function   BRIEF CLINICAL NOTE AND INDICATIONS FOR SURGERY  This 73 year old gentleman has stage D, severe, symptomatic aortic stenosis with NYHA class II symptoms of fatigue as well as episodes of intermittent lightheadedness.  I agree that aortic valve replacement is indicated for relief of his symptoms and to prevent progressive left ventricular dysfunction.  His outside echo reports a mean gradient of 48 mmHg across the aortic valve with a left ventricular ejection fraction of 55%.  Cardiac catheterization shows a widely patent stent in the LAD with minimal nonobstructive disease elsewhere.  I agree that aortic valve replacement is indicated for relief of his symptoms and to prevent progressive left ventricular dysfunction.  Given his age I think a transcatheter aortic valve replacement be the best option for treating him.  His gated cardiac CTA shows anatomy suitable for TAVR using a 29 mm SAPIEN 3 valve.  His abdominal and pelvic CTA shows adequate pelvic vascular anatomy to allow transfemoral insertion.  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   The patient and his wife  were counseled at length regarding treatment alternatives for management of severe symptomatic aortic stenosis. The risks and benefits of surgical intervention has been discussed in detail. Long-term prognosis with medical therapy was discussed. Alternative approaches such as conventional surgical aortic valve replacement, transcatheter aortic valve replacement, and palliative medical therapy were compared and contrasted at length. This discussion was placed in the context of the patient's own specific clinical presentation and past medical history. All of their questions have been addressed.    Following the decision to proceed with transcatheter aortic valve replacement, a discussion was held regarding what types of management strategies would be attempted intraoperatively in the event of life-threatening complications, including whether or not the patient would be considered a candidate for the use of cardiopulmonary bypass and/or conversion to open sternotomy for attempted surgical intervention.  I think he would be a candidate for emergent sternotomy to manage any intraoperative complications.  The patient has been advised of a variety of complications that might develop including but not limited to risks of death, stroke, paravalvular leak, aortic dissection or other major vascular complications, aortic annulus rupture, device embolization, cardiac rupture or perforation, mitral regurgitation, acute myocardial infarction, arrhythmia, heart block or bradycardia requiring permanent pacemaker placement, congestive heart failure, respiratory failure, renal failure, pneumonia, infection, other late complications related to structural valve deterioration or migration, or other complications that might ultimately cause a temporary or permanent loss of functional independence or other long term morbidity. The patient provides full informed consent for the procedure as described and all questions were answered.        DETAILS OF THE OPERATIVE PROCEDURE  PREPARATION:    The patient was brought to  the operating room on the above mentioned date and appropriate monitoring was established by the anesthesia team. The patient was placed in the supine position on the operating table.  Intravenous antibiotics were administered. The patient was monitored closely throughout the procedure under conscious sedation.    Baseline transthoracic echocardiogram was performed. The patient's abdomen and both groins were prepped and draped in a sterile manner. A time out procedure was performed.   PERIPHERAL ACCESS:    Using the modified Seldinger technique, a 6 Fr sheath was placed in the right internal jugular vein.  A temporary transvenous pacemaker catheter was passed through the venous sheath under fluoroscopic guidance into the right ventricle.  The pacemaker was tested to ensure stable lead placement and pacemaker capture. Then femoral arterial and venous access was obtained with placement of 6 Fr sheaths on the right side.  A pigtail diagnostic catheter was passed through the right arterial sheath under fluoroscopic guidance into the aortic root.  Aortic root angiography was performed in order to determine the optimal angiographic angle for valve deployment.   TRANSFEMORAL ACCESS:   Percutaneous transfemoral access and sheath placement was performed using ultrasound guidance.  The left common femoral artery was cannulated using a micropuncture needle and appropriate location was verified using hand injection angiogram.  A pair of Abbott Perclose percutaneous closure devices were placed and a 6 French sheath replaced into the femoral artery.  The patient was heparinized systemically and ACT verified > 250 seconds.    A 16 Fr transfemoral E-sheath was introduced into the left common femoral artery after progressively dilating over an Amplatz superstiff wire. An AL-1 catheter was used to direct a straight-tip  exchange length wire across the native aortic valve into the left ventricle. This was exchanged out for a pigtail catheter and position was confirmed in the LV apex. Simultaneous LV and Ao pressures were recorded.  The pigtail catheter was exchanged for a Safari wire in the LV apex.   BALLOON AORTIC VALVULOPLASTY:   Balloon aortic valvuloplasty was performed using a 12 mm Mustang balloon.  Once optimal position was achieved, BAV was done under rapid ventricular pacing. The patient recovered well hemodynamically.    TRANSCATHETER HEART VALVE DEPLOYMENT:   An Edwards Sapien 3 Ultra transcatheter heart valve (size 29 mm) was prepared and crimped per manufacturer's guidelines, and the proper orientation of the valve is confirmed on the [employer] delivery system. The valve was advanced through the introducer sheath using normal technique until in an appropriate position in the abdominal aorta beyond the sheath tip. The balloon was then retracted and using the fine-tuning wheel was centered on the valve. The valve was then advanced across the aortic arch using appropriate flexion of the catheter. The valve was carefully positioned across the aortic valve annulus. The Commander catheter was retracted using normal technique. Once final position of the valve has been confirmed by angiographic assessment, the valve is deployed during rapid ventricular pacing to maintain systolic blood pressure < 50 mmHg and pulse pressure < 10 mmHg. The balloon inflation is held for >3 seconds after reaching full deployment volume. Once the balloon has fully deflated the balloon is retracted into the ascending aorta and valve function is assessed using echocardiography. There is felt to be no paravalvular leak and no central aortic insufficiency.  The patient's hemodynamic recovery following valve deployment is good.  The deployment balloon and guidewire are both removed.    PROCEDURE COMPLETION:   The sheath was removed  and  femoral artery closure performed.  Protamine  was administered once femoral arterial repair was complete. The temporary pacemaker, pigtail catheter and femoral sheaths were removed with manual pressure used for venous hemostasis.  A Mynx femoral closure device was utilized following removal of the diagnostic sheath in the right femoral artery.  The patient tolerated the procedure well and is transported to the cath lab recovery area in stable condition. There were no immediate intraoperative complications. All sponge instrument and needle counts are verified correct at completion of the operation.   No blood products were administered during the operation.  The patient received a total of 65 mL of intravenous contrast during the procedure.   Dorise MARLA Fellers, MD 11/21/2023

## 2023-11-21 NOTE — Transfer of Care (Signed)
 Immediate Anesthesia Transfer of Care Note  Patient: Carlos Simon  Procedure(s) Performed: Transcatheter Aortic Valve Replacement, Transfemoral ECHOCARDIOGRAM, TRANSTHORACIC  Patient Location: Cath Lab  Anesthesia Type:MAC  Level of Consciousness: drowsy and patient cooperative  Airway & Oxygen Therapy: Patient Spontanous Breathing and Patient connected to nasal cannula oxygen  Post-op Assessment: Report given to RN and Post -op Vital signs reviewed and stable  Post vital signs: Reviewed and stable  Last Vitals:  Vitals Value Taken Time  BP 120/71   Temp    Pulse 63 11/21/23 11:43  Resp 19 11/21/23 11:43  SpO2 100 % 11/21/23 11:43  Vitals shown include unfiled device data.  Last Pain:  Vitals:   11/21/23 0728  TempSrc:   PainSc: 0-No pain         Complications: No notable events documented.

## 2023-11-21 NOTE — Progress Notes (Signed)
 Patient arrived at the unit from cath lab,Upon arrival pt is alert and oriented x4,CHG bath given, vitals taken,CCMD notified old drainage present on left groin,rt groin and rt internal jugular site level 0,bilateral dorsalis pedis pulse palpable,pt oriented to the unit, call bell in reach

## 2023-11-21 NOTE — Anesthesia Preprocedure Evaluation (Signed)
 Anesthesia Evaluation  Patient identified by MRN, date of birth, ID band Patient awake    Reviewed: Allergy & Precautions, H&P , NPO status , Patient's Chart, lab work & pertinent test results  Airway Mallampati: II   Neck ROM: full    Dental   Pulmonary asthma    breath sounds clear to auscultation       Cardiovascular + dysrhythmias + Valvular Problems/Murmurs AS  Rhythm:regular Rate:Normal     Neuro/Psych    GI/Hepatic   Endo/Other    Renal/GU      Musculoskeletal   Abdominal   Peds  Hematology   Anesthesia Other Findings   Reproductive/Obstetrics                              Anesthesia Physical Anesthesia Plan  ASA: 3  Anesthesia Plan: MAC   Post-op Pain Management:    Induction: Intravenous  PONV Risk Score and Plan: 1 and Propofol  infusion and Treatment may vary due to age or medical condition  Airway Management Planned: Simple Face Mask  Additional Equipment: Arterial line  Intra-op Plan:   Post-operative Plan:   Informed Consent: I have reviewed the patients History and Physical, chart, labs and discussed the procedure including the risks, benefits and alternatives for the proposed anesthesia with the patient or authorized representative who has indicated his/her understanding and acceptance.     Dental advisory given  Plan Discussed with: CRNA, Anesthesiologist and Surgeon  Anesthesia Plan Comments:         Anesthesia Quick Evaluation

## 2023-11-21 NOTE — Progress Notes (Signed)
 Right femoral sheath removed. Direct pressure held for about 10 min. Level 0. Gauze dressing placed transparent cover placed.

## 2023-11-21 NOTE — Interval H&P Note (Signed)
 History and Physical Interval Note:  11/21/2023 9:19 AM  Carlos Simon  has presented today for surgery, with the diagnosis of Severe Aortic Stenosis.  The various methods of treatment have been discussed with the patient and family. After consideration of risks, benefits and other options for treatment, the patient has consented to  Procedure(s): Transcatheter Aortic Valve Replacement, Transfemoral (N/A) ECHOCARDIOGRAM, TRANSTHORACIC (N/A) as a surgical intervention.  The patient's history has been reviewed, patient examined, no change in status, stable for surgery.  I have reviewed the patient's chart and labs.  Questions were answered to the patient's satisfaction.     Malikye Reppond K Nehemyah Foushee

## 2023-11-22 ENCOUNTER — Inpatient Hospital Stay (HOSPITAL_COMMUNITY)

## 2023-11-22 ENCOUNTER — Inpatient Hospital Stay (HOSPITAL_BASED_OUTPATIENT_CLINIC_OR_DEPARTMENT_OTHER)
Admit: 2023-11-22 | Discharge: 2023-11-22 | Disposition: A | Discharge status: Home / Self Care | Attending: Student | Admitting: Student

## 2023-11-22 ENCOUNTER — Other Ambulatory Visit (HOSPITAL_COMMUNITY): Payer: Self-pay

## 2023-11-22 DIAGNOSIS — I2510 Atherosclerotic heart disease of native coronary artery without angina pectoris: Secondary | ICD-10-CM | POA: Insufficient documentation

## 2023-11-22 DIAGNOSIS — Z952 Presence of prosthetic heart valve: Secondary | ICD-10-CM

## 2023-11-22 DIAGNOSIS — I350 Nonrheumatic aortic (valve) stenosis: Principal | ICD-10-CM

## 2023-11-22 DIAGNOSIS — E785 Hyperlipidemia, unspecified: Secondary | ICD-10-CM | POA: Insufficient documentation

## 2023-11-22 LAB — ECHOCARDIOGRAM COMPLETE
AR max vel: 3.84 cm2
AV Area VTI: 3.86 cm2
AV Area mean vel: 3.81 cm2
AV Mean grad: 10 mmHg
AV Peak grad: 15.7 mmHg
Ao pk vel: 1.98 m/s
Area-P 1/2: 1.89 cm2
Height: 68 in
S' Lateral: 2.7 cm
Weight: 2281.6 [oz_av]

## 2023-11-22 LAB — CBC
HCT: 36.6 % — ABNORMAL LOW (ref 39.0–52.0)
Hemoglobin: 13.1 g/dL (ref 13.0–17.0)
MCH: 31.4 pg (ref 26.0–34.0)
MCHC: 35.8 g/dL (ref 30.0–36.0)
MCV: 87.8 fL (ref 80.0–100.0)
Platelets: 181 K/uL (ref 150–400)
RBC: 4.17 MIL/uL — ABNORMAL LOW (ref 4.22–5.81)
RDW: 12.4 % (ref 11.5–15.5)
WBC: 9 K/uL (ref 4.0–10.5)
nRBC: 0 % (ref 0.0–0.2)

## 2023-11-22 LAB — MAGNESIUM: Magnesium: 1.9 mg/dL (ref 1.7–2.4)

## 2023-11-22 LAB — BASIC METABOLIC PANEL WITH GFR
Anion gap: 9 (ref 5–15)
BUN: 8 mg/dL (ref 8–23)
CO2: 24 mmol/L (ref 22–32)
Calcium: 9 mg/dL (ref 8.9–10.3)
Chloride: 103 mmol/L (ref 98–111)
Creatinine, Ser: 0.93 mg/dL (ref 0.61–1.24)
GFR, Estimated: >60 mL/min (ref >60)
Glucose, Bld: 103 mg/dL — ABNORMAL HIGH (ref 70–99)
Potassium: 4.4 mmol/L (ref 3.5–5.1)
Sodium: 136 mmol/L (ref 135–145)

## 2023-11-22 MED FILL — Midazolam HCl Inj 2 MG/2ML (Base Equivalent): INTRAMUSCULAR | Qty: 2 | Status: AC

## 2023-11-22 NOTE — Progress Notes (Signed)
 Echocardiogram 2D Echocardiogram has been performed.  Damien FALCON Katelynne Revak RDCS 11/22/2023, 10:17 AM

## 2023-11-22 NOTE — Progress Notes (Signed)
 Removed tele.  Cardiac tele unit called.   PIV removed to right forearm.  No PIV to left arm during assessment.  SABRA

## 2023-11-22 NOTE — Discharge Summary (Addendum)
 Discharge Summary   Patient ID: Carlos Simon MRN: [id number]; DOB: 1950/08/19  Admit date: 11/21/2023 Discharge date: 11/22/2023  PCP:  [facility] Providers Cardiologist:  [place]. Patrice ([place])   Discharge Diagnoses  Principal Problem:   Aortic stenosis Active Problems:   CAD (coronary artery disease)   Hyperlipidemia   Diagnostic Studies/Procedures   TAVR 11/21/2023: Underwent transcatheter aortic valve replacement with Edwards Sapien 3 Resilia THV (size 29 mm, model # 9755RLS, serial # [id number]). _______________  Limited Echo 11/21/2023: 1. Left ventricular ejection fraction, by estimation, is 60 to 65%. The  left ventricle has normal function. There is severe asymmetric left  ventricular hypertrophy of the basal-septal segment.   2. Enlongated anterior leaflet with chordal systolic anterior motion of  the mitral valve, unclear mechanism of the mitral regurgitation (location  is where SAM related MR would be). The mitral valve is abnormal. Mild  mitral valve regurgitation. No  evidence of mitral stenosis.   3. Prior to procedure calcified valve with mean gradient 47 mm Hg, Peak  gradient 78 mm Hg, DVI 0.24 no AI.      After procedure a 29 mm Sapien Valve placed. No PVL. Mean gradient 6  mm Hg, Peak gradient 9 mm Hg. Suboptimal LVOT Acquisition (EOA and DVI not  reported). Procedure Date: 11/21/23.  _______________  Complete Echo 11/22/2023: Impressions:  1. There is flow acceleration in the LVOT with a resting gradient of  and a gradient with Valsalva up to resulting in mild chordal  SAM. This may be consistent with hyperdynamic function immediately post  TAVR. Left ventricular ejection  fraction, by estimation, is 70 to 75%. The left ventricle has hyperdynamic  function. The left ventricle has no regional wall motion abnormalities.  There is mild concentric left ventricular hypertrophy. Left ventricular  diastolic  parameters are consistent   with Grade I diastolic dysfunction (impaired relaxation).   2. Right ventricular systolic function is normal. The right ventricular  size is normal.   3. 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  The mitral valve is abnormal. Mild to moderate mitral valve regurgitation.   4. The aortic valve has been repaired/replaced. Aortic valve  regurgitation is not visualized. There is a 29 mm Edwards Sapien  prosthetic (TAVR) valve present in the aortic position. Procedure Date:  11/21/23. Echo findings are consistent with normal  structure and function of the aortic valve prosthesis. Aortic valve area,  by VTI measures 3.86 cm. Aortic valve mean gradient measures 10.0 mmHg.  Aortic valve Vmax measures 1.98 m/s.   5. There is borderline dilatation of the ascending aorta, measuring 38  mm.   6. The inferior vena cava is dilated in size with >50% respiratory  variability, suggesting right atrial pressure of 8 mmHg.    History of Present Illness   Carlos Simon is a 73 y.o. male with CAD s/p DES to proximal LAD in 05/2022 (patent on recent cath in 08/2023), severe aortic stenosis, and hyperlipidemia. He follows with Dr. Lucienne at the [facility] and was recently referred to Dr. Wendel for evaluation of severe aortic stenosis. TAVR was recommended and patient presented for this as planned on 11/21/2023.  Hospital Course    Patient underwent TAVR with Edwards Sapien 3 Resilia THV (size 29 mm, model # 9755RLS, serial # [id number]) on 7/22/205 with Dr. Wendel and Dr. Lucas. Patient tolerated the procedure well. Initial Echo post TAVR showed stable valve with no PVL and  mean gradient of 6 mmHg. He reports feeling great on day of discharge and was able to ambulate without any issues. Groin stable. Echo on day of discharge (POD1) showed normal structure and function of aortic valve prothesis. EKG on day of discharge showed a new LBBB. He had a pre-existing incomplete  RBBB and LAFB. Will place a 2 week live Zio monitor prior to discharge. Labs stable. Continue Aspirin  81mg  daily and Lipitor 40mg  daily.   Of note, initial Echo post TAVR showed severe asymmetric LVH of the basal-septal segment and elongated anterior leaflet with chordal SAM of the mitral valve and mild MR. Echo POD1 showed LVEF of 70-75% with mild LVH and flow acceleration in the LVOT with a resting gradient of 14 mmHg and a gradient with Valsalva up to 40 mmHg resulting in mild chordal SAM as well as mild to moderate MR. He denies ever being diagnosed with hypertrophic cardiomyopathy. This could be consistent with hyperdynamic function immediately post TAVR . He has routine post-op Echo scheduled for 12/28/2023. Otherwise, will defer additional work-up of this to his primary Cardiologist at the [facility] (Dr. Lucienne). Will hold off on adding beta-blocker due to new LBBB and plan for Zio monitor to rule out other conduction issues. Advised patient to stay well hydrated and avoid dehydration.  Patient was seen and examined by Dr. Wendel today and felt to be stable for discharge. Outpatient follow-up and post-op Echo arranged. Medications as below.    Did the patient have an acute coronary syndrome (MI, NSTEMI, STEMI, etc) this admission?:  No                               Did the patient have a percutaneous coronary intervention (stent / angioplasty)?:  No.    _____________  Discharge Vitals Blood pressure (!) 150/73, pulse 76, temperature 97.8 F (36.6 C), temperature source Oral, resp. rate 20, height 5' 8 (1.727 m), weight 64.7 kg, SpO2 96%.  Filed Weights   11/21/23 0710 11/22/23 0412  Weight: 65.8 kg 64.7 kg    Labs & Radiologic Studies  CBC Recent Labs    11/21/23 1039 11/22/23 0319  WBC  --  9.0  HGB 11.6* 13.1  HCT 34.0* 36.6*  MCV  --  87.8  PLT  --  181   Basic Metabolic Panel Recent Labs    92/77/74 1039 11/22/23 0319  NA 138 136  K 3.7 4.4  CL 102 103  CO2  --  24   GLUCOSE 115* 103*  BUN 7* 8  CREATININE 0.60* 0.93  CALCIUM  --  9.0  MG  --  1.9   Liver Function Tests No results for input(s): AST, ALT, ALKPHOS, BILITOT, PROT, ALBUMIN in the last 72 hours. No results for input(s): LIPASE, AMYLASE in the last 72 hours. High Sensitivity Troponin:   No results for input(s): TROPONINIHS in the last 720 hours.  No results for input(s): TRNPT in the last 720 hours.  BNP Invalid input(s): POCBNP No results for input(s): PROBNP in the last 72 hours.  No results for input(s): BNP in the last 72 hours.  D-Dimer No results for input(s): DDIMER in the last 72 hours. Hemoglobin A1C No results for input(s): HGBA1C in the last 72 hours. Fasting Lipid Panel No results for input(s): CHOL, HDL, LDLCALC, TRIG, CHOLHDL, LDLDIRECT in the last 72 hours. No results found for: LIPOA  Thyroid Function Tests No results for input(s): TSH,  T4TOTAL, T3FREE, THYROIDAB in the last 72 hours.  Invalid input(s): FREET3 _____________  ECHOCARDIOGRAM COMPLETE Result Date: 11/22/2023    ECHOCARDIOGRAM REPORT   Patient Name:   Carlos Simon Date of Exam: 11/22/2023 Medical Rec #:  [id number]    Height:       68.0 in Accession #:    [id number]   Weight:       142.6 lb Date of Birth:  06/16/1950   BSA:          1.770 m Patient Age:    72 years     BP:           115/70 mmHg Patient Gender: M            HR:           76 bpm. Exam Location:  Inpatient Procedure: 2D Echo, Color Doppler and Cardiac Doppler (Both Spectral and Color            Flow Doppler were utilized during procedure). Indications:    Post TAVR Evaluation z95.2  History:        Patient has prior history of Echocardiogram examinations, most                 recent 11/21/2023.                 Aortic Valve: 29 mm Edwards Sapien prosthetic, stented (TAVR)                 valve is present in the aortic position. Procedure Date:                 11/21/23.  Sonographer:    Damien  Senior RDCS Referring Phys: Channing Savich K Melva Faux IMPRESSIONS  1. There is flow acceleration in the LVOT with a resting gradient of and a gradient with Valsalva up to resulting in mild chordal SAM. This may be consistent with hyperdynamic function immediately post TAVR. Left ventricular ejection fraction, by estimation, is 70 to 75%. The left ventricle has hyperdynamic function. The left ventricle has no regional wall motion abnormalities. There is mild concentric left ventricular hypertrophy. Left ventricular diastolic parameters are consistent  with Grade I diastolic dysfunction (impaired relaxation).  2. Right ventricular systolic function is normal. The right ventricular size is normal.  3. 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 The mitral valve is abnormal. Mild to moderate mitral valve regurgitation.  4. The aortic valve has been repaired/replaced. Aortic valve regurgitation is not visualized. There is a 29 mm Edwards Sapien prosthetic (TAVR) valve present in the aortic position. Procedure Date: 11/21/23. Echo findings are consistent with normal structure and function of the aortic valve prosthesis. Aortic valve area, by VTI measures 3.86 cm. Aortic valve mean gradient measures 10.0 mmHg. Aortic valve Vmax measures 1.98 m/s.  5. There is borderline dilatation of the ascending aorta, measuring 38 mm.  6. The inferior vena cava is dilated in size with >50% respiratory variability, suggesting right atrial pressure of 8 mmHg. FINDINGS  Left Ventricle: There is flow acceleration in the LVOT with a resting gradient of and a gradient with Valsalva up to resulting in mild chordal SAM. This may be consistent with hyperdynamic function immediately post TAVR. Left ventricular ejection fraction, by estimation, is 70 to 75%. The left ventricle has hyperdynamic function. The left ventricle has no regional wall motion abnormalities. The left ventricular internal cavity size  was normal in size. There is mild concentric  left ventricular hypertrophy. Left ventricular diastolic parameters are consistent with Grade I diastolic dysfunction (impaired relaxation). Right Ventricle: The right ventricular size is normal. No increase in right ventricular wall thickness. Right ventricular systolic function is normal. Left Atrium: Left atrial size was normal in size. Right Atrium: Right atrial size was normal in size. Pericardium: There is no evidence of pericardial effusion. 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 The mitral valve is abnormal. Mild to moderate mitral valve regurgitation. Tricuspid Valve: The tricuspid valve is normal in structure. Tricuspid valve regurgitation is trivial. Aortic Valve: The aortic valve has been repaired/replaced. Aortic valve regurgitation is not visualized. Aortic valve mean gradient measures 10.0 mmHg. Aortic valve peak gradient measures 15.7 mmHg. Aortic valve area, by VTI measures 3.86 cm. There is a  29 mm Edwards Sapien prosthetic, stented (TAVR) valve present in the aortic position. Procedure Date: 11/21/23. Echo findings are consistent with normal structure and function of the aortic valve prosthesis. Pulmonic Valve: The pulmonic valve was normal in structure. Pulmonic valve regurgitation is mild. Aorta: The aortic root is normal in size and structure. There is borderline dilatation of the ascending aorta, measuring 38 mm. Venous: The inferior vena cava is dilated in size with greater than 50% respiratory variability, suggesting right atrial pressure of 8 mmHg. IAS/Shunts: No atrial level shunt detected by color flow Doppler.  LEFT VENTRICLE PLAX 2D LVIDd:         3.90 cm   Diastology LVIDs:         2.70 cm   LV e' medial:    5.66 cm/s LV PW:         1.20 cm   LV E/e' medial:  8.5 LV IVS:        1.40 cm   LV e' lateral:   9.46 cm/s LVOT diam:     2.40 cm   LV E/e' lateral: 5.1 LV SV:         132 LV SV Index:   74  LVOT Area:     4.52 cm  RIGHT VENTRICLE RV S prime:     15.60 cm/s TAPSE (M-mode): 2.6 cm LEFT ATRIUM             Index        RIGHT ATRIUM           Index LA diam:        3.20 cm 1.81 cm/m   RA Area:     18.20 cm LA Vol (A2C):   58.2 ml 32.88 ml/m  RA Volume:   52.40 ml  29.60 ml/m LA Vol (A4C):   52.3 ml 29.55 ml/m LA Biplane Vol: 59.2 ml 33.45 ml/m  AORTIC VALVE AV Area (Vmax):    3.84 cm AV Area (Vmean):   3.81 cm AV Area (VTI):     3.86 cm AV Vmax:           198.00 cm/s AV Vmean:          152.000 cm/s AV VTI:            0.341 m AV Peak Grad:      15.7 mmHg AV Mean Grad:      10.0 mmHg LVOT Vmax:         168.00 cm/s LVOT Vmean:        128.000 cm/s LVOT VTI:          0.291 m LVOT/AV VTI ratio: 0.85  AORTA Ao Asc diam: 3.80  cm MITRAL VALVE MV Area (PHT): 1.89 cm    SHUNTS MV Decel Time: 402 msec    Systemic VTI:  0.29 m MV E velocity: 48.20 cm/s  Systemic Diam: 2.40 cm MV A velocity: 89.60 cm/s MV E/A ratio:  0.54 Morene Brownie Electronically signed by Morene Brownie Signature Date/Time: 11/22/2023/1:25:53 PM    Final    DG Chest Port 1 View Result Date: 11/21/2023 CLINICAL DATA:  Aortic stenosis EXAM: PORTABLE CHEST 1 VIEW COMPARISON:  11/17/2023 FINDINGS: Interval TAVR placement. Heart size within normal limits. The lungs appear clear. No blunting of the costophrenic angles. Subtle dextroconvex upper thoracic scoliosis. IMPRESSION: 1. Interval TAVR placement. 2. No acute findings. 3. Subtle dextroconvex upper thoracic scoliosis. Electronically Signed   By: Ryan Salvage M.D.   On: 11/21/2023 15:35   ECHOCARDIOGRAM LIMITED Result Date: 11/21/2023    ECHOCARDIOGRAM LIMITED REPORT   Patient Name:   Carlos Simon Date of Exam: 11/21/2023 Medical Rec #:  [id number]    Height:       68.0 in Accession #:    [id number]   Weight:       145.0 lb Date of Birth:  10-04-1950   BSA:          1.783 m Patient Age:    72 years     BP:           121/73 mmHg Patient Gender: M            HR:           60 bpm.  Exam Location:  Inpatient Procedure: Limited Echo (Both Spectral and Color Flow Doppler were utilized            during procedure). Indications:     Aortic stenosis  History:         Patient has prior history of Echocardiogram examinations, most                  recent 10/06/2023. CAD; Risk Factors:Dyslipidemia.  Sonographer:     Tinnie Barefoot RDCS Referring Phys:  [id number] Sharalyn Lomba K Sherial Ebrahim Diagnosing Phys: Stanly Leavens MD IMPRESSIONS  1. Left ventricular ejection fraction, by estimation, is 60 to 65%. The left ventricle has normal function. There is severe asymmetric left ventricular hypertrophy of the basal-septal segment.  2. Enlongated anterior leaflet with chordal systolic anterior motion of the mitral valve, unclear mechanism of the mitral regurgitation (location is where SAM related MR would be). The mitral valve is abnormal. Mild mitral valve regurgitation. No evidence of mitral stenosis.  3. Prior to procedure calcified valve with mean gradient 47 mm Hg, Peak gradient 78 mm Hg, DVI 0.24 no AI.     After procedure a 29 mm Sapien Valve placed. No PVL. Mean gradient 6 mm Hg, Peak gradient 9 mm Hg. Suboptimal LVOT Acquisition (EOA and DVI not reported). Procedure Date: 11/21/23. FINDINGS  Left Ventricle: Left ventricular ejection fraction, by estimation, is 60 to 65%. The left ventricle has normal function. There is severe asymmetric left ventricular hypertrophy of the basal-septal segment. Mitral Valve: Enlongated anterior leaflet with chordal systolic anterior motion of the mitral valve, unclear mechanism of the mitral regurgitation (location is where SAM related MR would be). The mitral valve is abnormal. Mild mitral valve regurgitation.  No evidence of mitral valve stenosis. Tricuspid Valve: The tricuspid valve is normal in structure. Tricuspid valve regurgitation is not demonstrated. No evidence of tricuspid stenosis. Aortic Valve: Prior to procedure calcified valve with mean gradient 47 mm Hg,  Peak  gradient 78 mm Hg, DVI 0.24 no AI. After procedure a 29 mm Sapien Valve placed. No PVL. Mean gradient 6 mm Hg, Peak gradient 9 mm Hg. Suboptimal LVOT Acquisition (EOA and DVI not reported). Aortic valve mean gradient measures 6.0 mmHg. Aortic valve peak gradient measures 9.2 mmHg. Aortic valve area, by VTI measures 2.52 cm. There is a 29 mm Edwards Sapien prosthetic, stented (TAVR) valve present in the aortic position. Aorta: The aortic root and ascending aorta are structurally normal, with no evidence of dilitation. Additional Comments: Spectral Doppler performed. Color Doppler performed.  LEFT VENTRICLE PLAX 2D LVIDd:         4.50 cm LVIDs:         2.60 cm LV PW:         1.10 cm LV IVS:        1.20 cm LVOT diam:     2.00 cm LV SV:         67 LV SV Index:   38 LVOT Area:     3.14 cm  LEFT ATRIUM         Index LA diam:    3.00 cm 1.68 cm/m  AORTIC VALVE AV Area (Vmax):    2.52 cm AV Area (Vmean):   2.58 cm AV Area (VTI):     2.52 cm AV Vmax:           152.00 cm/s AV Vmean:          114.000 cm/s AV VTI:            0.267 m AV Peak Grad:      9.2 mmHg AV Mean Grad:      6.0 mmHg LVOT Vmax:         122.00 cm/s LVOT Vmean:        93.600 cm/s LVOT VTI:          0.214 m LVOT/AV VTI ratio: 0.80  AORTA Ao Root diam: 3.40 cm MITRAL VALVE               TRICUSPID VALVE MV Area (PHT): 2.29 cm    TR Peak grad:   15.5 mmHg MV Decel Time: 332 msec    TR Vmax:        197.00 cm/s MV E velocity: 56.60 cm/s MV A velocity: 84.40 cm/s  SHUNTS MV E/A ratio:  0.67        Systemic VTI:  0.21 m                            Systemic Diam: 2.00 cm Stanly Leavens MD Electronically signed by Stanly Leavens MD Signature Date/Time: 11/21/2023/3:05:07 PM    Final    Structural Heart Procedure Result Date: 11/21/2023 See surgical note for result.  DG Chest 2 View Result Date: 11/18/2023 CLINICAL DATA:  Severe aortic stenosis, preop exam. EXAM: CHEST - 2 VIEW COMPARISON:  Radiograph 10/09/2023, CT 10/19/2023 FINDINGS: Stable  heart size.The cardiomediastinal contours are unchanged. The lungs are clear. Pulmonary vasculature is normal. No consolidation, pleural effusion, or pneumothorax. No acute osseous abnormalities are seen. IMPRESSION: No active cardiopulmonary disease. Electronically Signed   By: Andrea Gasman M.D.   On: 11/18/2023 20:31    Disposition Pt is being discharged home today in good condition.  Follow-up Plans & Appointments  Follow-up Information     Sebastian Lamarr SAUNDERS, PA-C Follow up.   Specialties: Cardiology, Radiology Why: Hospital follow-up with Cardiology scheduled for  11/30/2023 at 1:05pm. Please arrive 15 mintues early for check-in. Contact information: [address] [place] [address] [phone]         [facility] at [facility] Dept of [employer] Follow up.   Specialty: Cardiology Why: Routine post-op echocardiogram scheduled for 12/28/2203 at 2:05pm. Please arrive 15 minutes early for check-in. Contact information: [address] [place]  [address] [phone]                 Discharge Medications Allergies as of 11/22/2023   No Known Allergies      Medication List     STOP taking these medications    metoprolol  tartrate 50 MG tablet Commonly known as: LOPRESSOR        TAKE these medications    acetaminophen  500 MG tablet Commonly known as: TYLENOL  Take 500-1,000 mg by mouth every 6 (six) hours as needed for moderate pain (pain score 4-6).   Acidophilus Lactobacillus Caps Take 2 capsules by mouth in the morning and at bedtime.   aspirin  81 MG tablet Take 81 mg by mouth daily.   atorvastatin 40 MG tablet Commonly known as: LIPITOR Take 40 mg by mouth daily.   BENEFIBER PREBIOTIC+PROBIOTIC PO Take 2 Scoops by mouth every morning.   Eucerin Advanced Repair Crea Apply 1 Application topically as needed (dry skin).   GLUCOSAMINE CHOND MSM FORMULA PO Take 1 tablet by mouth daily.   hydrocortisone  cream 1 % Apply 1 Application topically as needed for itching.   multivitamin with minerals tablet Take 1 tablet by mouth daily. Men's Multivitamin   psyllium 0.52 g capsule Commonly known as: REGULOID Take 2 capsules by mouth in the morning and at bedtime.         Outstanding Labs/Studies  N/A  Duration of Discharge Encounter: APP Time: 20 minutes   Signed, Callie E Goodrich, PA-C 11/22/2023, 1:49 PM   ATTENDING ATTESTATION:  After conducting a review of all available clinical information with the care team, interviewing the patient, and performing a physical exam, I agree with the findings and plan described in this note.   GEN: No acute distress.   HEENT:  MMM, no JVD, no scleral icterus Cardiac: RRR, no murmurs, rubs, or gallops.  Respiratory: Clear to auscultation bilaterally. GI: Soft, nontender, non-distended  MS: No edema; No deformity. Neuro:  Nonfocal  Vasc:  +2 radial pulses; access sites intact  Patient doing well after TAVR with stable access sites, no evidence of stroke, and no conduction abnormalities.  Follow up echocardiogram with planned discharge and close hospital follow up.  Plan as below:  Aortic stenosis: Status post 29 mm SAPIEN 3 valve from the left transfemoral approach.  The patient has developed a new left bundle branch block.  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  Will place 2 week ZIO monitor to evaluate.  Continue aspirin  81 mg.  Follow-up echocardiogram today and plan on discharge today with close hospital follow-up next week. Coronary artery disease: Status post PCI of LAD in 2024; continue aspirin  81 mg and atorvastatin 40 mg (per primary cardiologist). Hyperlipidemia: Continue atorvastatin 40 mg. New left bundle branch block: Plan on 2 week ZIO monitor to evaluate for late heart block.   APP discharge time:20 MD discharge time:25  Lurena Red, MD Pager [phone]

## 2023-11-22 NOTE — Progress Notes (Signed)
 CARDIAC REHAB PHASE I    Post TAVR education including site care, restrictions, risk factors, heart healthy diet, exercise guidelines and CRP2 reviewed. All questions and concerns addressed. Pt is not interested in CRP2 at this time. Will discuss with MD at office follow up if interested in CRP2 in the future. Plan for discharge home later today.   [id number] Vaughn Asberry Hacking, RN BSN 11/22/2023 11:20 AM

## 2023-11-22 NOTE — Progress Notes (Signed)
 Explained discharge instructions to patient. Reviewed follow up appointment and next medication administration times. Also reviewed education. Patient verbalized having an understanding for instructions given. All belongings are in the patient's possession.  IV and telemetry were removed. CCMD was notified. No other needs verbalized. Volunteer services will transport downstairs for discharge.

## 2023-11-22 NOTE — Discharge Instructions (Signed)
ACTIVITY AND EXERCISE °• Daily activity and exercise are an important part of your recovery. People recover at different rates depending on their general health and type of valve procedure. °• Most people recovering from TAVR feel better relatively quickly  °• No lifting, pushing, pulling more than 10 pounds (examples to avoid: groceries, vacuuming, gardening, golfing): °            - For one week with a procedure through the groin. °            - For six weeks for procedures through the chest wall or neck. °NOTE: You will typically see one of our providers 7-14 days after your procedure to discuss WHEN TO RESUME the above activities.  °  °  °DRIVING °• Do not drive until you are seen for follow up and cleared by a provider. Generally, we ask patient to not drive for 1 week after their procedure. °• If you have been told by your doctor in the past that you may not drive, you must talk with him/her before you begin driving again. °  °DRESSING °• Groin site: you may leave the clear dressing over the site for up to one week or until it falls off. °  °HYGIENE °• If you had a femoral (leg) procedure, you may take a shower when you return home. After the shower, pat the site dry. Do NOT use powder, oils or lotions in your groin area until the site has completely healed. °• If you had a chest procedure, you may shower when you return home unless specifically instructed not to by your discharging practitioner. °            - DO NOT scrub incision; pat dry with a towel. °            - DO NOT apply any lotions, oils, powders to the incision. °            - No tub baths / swimming for at least 2 weeks. °• If you notice any fevers, chills, increased pain, swelling, bleeding or pus, please contact your doctor. °  °ADDITIONAL INFORMATION °• If you are going to have an upcoming dental procedure, please contact our office as you will require antibiotics ahead of time to prevent infection on your heart valve.  ° ° °If you have any  questions or concerns you can call the structural heart phone during normal business hours 8am-4pm. If you have an urgent need after hours or weekends please call [phone] to talk to the on call provider for general cardiology. If you have an emergency that requires immediate attention, please call 911.  ° ° °After TAVR Checklist ° °Check  Test Description  ° Follow up appointment in 1-2 weeks  You will see our structural heart physician assistant, Carlos Simon. Your incision sites will be checked and you will be cleared to drive and resume all normal activities if you are doing well.    ° 1 month echo and follow up  You will have an echo to check on your new heart valve and be seen back in the office by Carlos Simon. Many times the echo is not read by your appointment time, but Carlos will call you later that day or the following day to report your results.  ° Follow up with your primary cardiologist You will need to be seen by your primary cardiologist in the following 3-6 months after your 1 month appointment in the valve   clinic. Often times your Plavix or Aspirin will be discontinued during this time, but this is decided on a case by case basis.   ° 1 year echo and follow up You will have another echo to check on your heart valve after 1 year and be seen back in the office by Carlos Simon. This your last structural heart visit.  ° Bacterial endocarditis prophylaxis  You will have to take antibiotics for the rest of your life before all dental procedures (even teeth cleanings) to protect your heart valve. Antibiotics are also required before some surgeries. Please check with your cardiologist before scheduling any surgeries. Also, please make sure to tell us if you have a penicillin allergy as you will require an alternative antibiotic.   ° ° °

## 2023-11-22 NOTE — Progress Notes (Addendum)
 Rounding Note   Patient Name: Carlos Simon Date of Encounter: 11/22/2023  [facility] Cardiologist: None Primary cardiologist: Lucienne [facility])  Subjective Status post 29 mm SAPIEN 3 valve from the left transfemoral approach yesterday without acute complications.  Feels great, no more dizziness.  Ambulating without issues.  Groins stable.  Scheduled Meds:  aspirin   81 mg Oral Daily   sodium chloride  flush  3 mL Intravenous Q12H   Continuous Infusions:  sodium chloride      nitroGLYCERIN      PRN Meds: sodium chloride , acetaminophen  **OR** acetaminophen , metoprolol  tartrate, morphine  injection, ondansetron  (ZOFRAN ) IV, oxyCODONE , sodium chloride  flush, traMADol    Vital Signs  Vitals:   11/21/23 1942 11/21/23 2000 11/21/23 2148 11/22/23 0412  BP: 118/70  (!) 116/59 115/70  Pulse: 66  68 68  Resp: 18 20 18 17   Temp: 98.1 F (36.7 C)  98 F (36.7 C) 98.4 F (36.9 C)  TempSrc: Oral  Oral Oral  SpO2: 98%  98% 98%  Weight:    64.7 kg  Height:        Intake/Output Summary (Last 24 hours) at 11/22/2023 0515 Last data filed at 11/21/2023 1820 Gross per 24 hour  Intake 1527.5 ml  Output --  Net 1527.5 ml      11/22/2023    4:12 AM 11/21/2023    7:10 AM 11/09/2023   10:42 AM  Last 3 Weights  Weight (lbs) 142 lb 9.6 oz 145 lb 151 lb  Weight (kg) 64.683 kg 65.772 kg 68.493 kg      Telemetry SR with LBBB - Personally Reviewed  ECG  Sinus rhythm with left bundle branch block- Personally Reviewed  Physical Exam  GEN: No acute distress.   Neck: No JVD Cardiac: RRR, no murmurs, rubs, or gallops.  Respiratory: Clear to auscultation bilaterally. GI: Soft, nontender, non-distended  MS: No edema; No deformity. Vasc:  Groins intact. Neuro:  Nonfocal  Psych: Normal affect   Labs High Sensitivity Troponin:  No results for input(s): TROPONINIHS in the last 720 hours.   Chemistry Recent Labs  Lab 11/17/23 0930 11/21/23 1039 11/22/23 0319  NA 139 138 136  K  3.8 3.7 4.4  CL 105 102 103  CO2 26  --  24  GLUCOSE 121* 115* 103*  BUN 8 7* 8  CREATININE 0.91 0.60* 0.93  CALCIUM 9.5  --  9.0  MG  --   --  1.9  PROT 6.8  --   --   ALBUMIN 4.1  --   --   AST 28  --   --   ALT 25  --   --   ALKPHOS 43  --   --   BILITOT 1.1  --   --   GFRNONAA >60  --  >60  ANIONGAP 8  --  9    Lipids No results for input(s): CHOL, TRIG, HDL, LABVLDL, LDLCALC, CHOLHDL in the last 168 hours.  Hematology Recent Labs  Lab 11/17/23 0930 11/21/23 1039 11/22/23 0319  WBC 6.0  --  9.0  RBC 4.51  --  4.17*  HGB 13.9 11.6* 13.1  HCT 40.4 34.0* 36.6*  MCV 89.6  --  87.8  MCH 30.8  --  31.4  MCHC 34.4  --  35.8  RDW 12.4  --  12.4  PLT 224  --  181   Thyroid No results for input(s): TSH, FREET4 in the last 168 hours.  BNPNo results for input(s): BNP, PROBNP in the last 168 hours.  DDimer No results for input(s): DDIMER in the last 168 hours.   Radiology  DG Chest Port 1 View Result Date: 11/21/2023 CLINICAL DATA:  Aortic stenosis EXAM: PORTABLE CHEST 1 VIEW COMPARISON:  11/17/2023 FINDINGS: Interval TAVR placement. Heart size within normal limits. The lungs appear clear. No blunting of the costophrenic angles. Subtle dextroconvex upper thoracic scoliosis. IMPRESSION: 1. Interval TAVR placement. 2. No acute findings. 3. Subtle dextroconvex upper thoracic scoliosis. Electronically Signed   By: Ryan Salvage M.D.   On: 11/21/2023 15:35   ECHOCARDIOGRAM LIMITED Result Date: 11/21/2023    ECHOCARDIOGRAM LIMITED REPORT   Patient Name:   Carlos Simon Date of Exam: 11/21/2023 Medical Rec #:  [id number]    Height:       68.0 in Accession #:    [id number]   Weight:       145.0 lb Date of Birth:  1950-09-27   BSA:          1.783 m Patient Age:    72 years     BP:           121/73 mmHg Patient Gender: M            HR:           60 bpm. Exam Location:  Inpatient Procedure: Limited Echo (Both Spectral and Color Flow Doppler were utilized             during procedure). Indications:     Aortic stenosis  History:         Patient has prior history of Echocardiogram examinations, most                  recent 10/06/2023. CAD; Risk Factors:Dyslipidemia.  Sonographer:     Tinnie Barefoot RDCS Referring Phys:  [id number] Vasili Fok K Naryiah Schley Diagnosing Phys: Stanly Leavens MD IMPRESSIONS  1. Left ventricular ejection fraction, by estimation, is 60 to 65%. The left ventricle has normal function. There is severe asymmetric left ventricular hypertrophy of the basal-septal segment.  2. Enlongated anterior leaflet with chordal systolic anterior motion of the mitral valve, unclear mechanism of the mitral regurgitation (location is where SAM related MR would be). The mitral valve is abnormal. Mild mitral valve regurgitation. No evidence of mitral stenosis.  3. Prior to procedure calcified valve with mean gradient 47 mm Hg, Peak gradient 78 mm Hg, DVI 0.24 no AI.     After procedure a 29 mm Sapien Valve placed. No PVL. Mean gradient 6 mm Hg, Peak gradient 9 mm Hg. Suboptimal LVOT Acquisition (EOA and DVI not reported). Procedure Date: 11/21/23. FINDINGS  Left Ventricle: Left ventricular ejection fraction, by estimation, is 60 to 65%. The left ventricle has normal function. There is severe asymmetric left ventricular hypertrophy of the basal-septal segment. Mitral Valve: Enlongated anterior leaflet with chordal systolic anterior motion of the mitral valve, unclear mechanism of the mitral regurgitation (location is where SAM related MR would be). The mitral valve is abnormal. Mild mitral valve regurgitation.  No evidence of mitral valve stenosis. Tricuspid Valve: The tricuspid valve is normal in structure. Tricuspid valve regurgitation is not demonstrated. No evidence of tricuspid stenosis. Aortic Valve: Prior to procedure calcified valve with mean gradient 47 mm Hg, Peak gradient 78 mm Hg, DVI 0.24 no AI. After procedure a 29 mm Sapien Valve placed. No PVL. Mean gradient 6 mm Hg,  Peak gradient 9 mm Hg. Suboptimal LVOT Acquisition (EOA and DVI not reported). Aortic valve mean gradient measures 6.0 mmHg. Aortic valve peak  gradient measures 9.2 mmHg. Aortic valve area, by VTI measures 2.52 cm. There is a 29 mm Edwards Sapien prosthetic, stented (TAVR) valve present in the aortic position. Aorta: The aortic root and ascending aorta are structurally normal, with no evidence of dilitation. Additional Comments: Spectral Doppler performed. Color Doppler performed.  LEFT VENTRICLE PLAX 2D LVIDd:         4.50 cm LVIDs:         2.60 cm LV PW:         1.10 cm LV IVS:        1.20 cm LVOT diam:     2.00 cm LV SV:         67 LV SV Index:   38 LVOT Area:     3.14 cm  LEFT ATRIUM         Index LA diam:    3.00 cm 1.68 cm/m  AORTIC VALVE AV Area (Vmax):    2.52 cm AV Area (Vmean):   2.58 cm AV Area (VTI):     2.52 cm AV Vmax:           152.00 cm/s AV Vmean:          114.000 cm/s AV VTI:            0.267 m AV Peak Grad:      9.2 mmHg AV Mean Grad:      6.0 mmHg LVOT Vmax:         122.00 cm/s LVOT Vmean:        93.600 cm/s LVOT VTI:          0.214 m LVOT/AV VTI ratio: 0.80  AORTA Ao Root diam: 3.40 cm MITRAL VALVE               TRICUSPID VALVE MV Area (PHT): 2.29 cm    TR Peak grad:   15.5 mmHg MV Decel Time: 332 msec    TR Vmax:        197.00 cm/s MV E velocity: 56.60 cm/s MV A velocity: 84.40 cm/s  SHUNTS MV E/A ratio:  0.67        Systemic VTI:  0.21 m                            Systemic Diam: 2.00 cm Stanly Leavens MD Electronically signed by Stanly Leavens MD Signature Date/Time: 11/21/2023/3:05:07 PM    Final    Structural Heart Procedure Result Date: 11/21/2023 See surgical note for result.   Cardiac Studies Echocardiogram today pending  Chest x-ray: No acute findings  Assessment & Plan  Aortic stenosis: Status post 29 mm SAPIEN 3 valve from the left transfemoral approach.  The patient has developed a new left bundle branch block.  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  Will place 2 week ZIO monitor to evaluate.  Continue aspirin  81 mg.  Follow-up echocardiogram today and plan on discharge today with close hospital follow-up next week. Coronary artery disease: Status post PCI of LAD in 2024; continue aspirin  81 mg and atorvastatin 40 mg (per primary cardiologist). Hyperlipidemia: Continue atorvastatin 40 mg. New left bundle branch block: Plan on 2 week ZIO monitor to evaluate for late heart block.    For questions or updates, please contact [facility] Please consult [email or web address] for contact info under     Signed, Malea Swilling K Sanaii Caporaso, MD  11/22/2023, 5:15 AM

## 2023-11-23 ENCOUNTER — Telehealth: Payer: Self-pay

## 2023-11-23 NOTE — Telephone Encounter (Signed)
 Patient contacted regarding discharge from [facility] on 11/22/23  Patient understands to follow up with provider Izetta Hummer, PA-C on 11/30/23 at 1:05PM at [address] Location. Patient understands discharge instructions? Yes Patient understands medications and regiment? Yes Patient understands to bring all medications to this visit? Yes

## 2023-11-23 NOTE — Anesthesia Postprocedure Evaluation (Signed)
 Anesthesia Post Note  Patient: Carlos Simon  Procedure(s) Performed: Transcatheter Aortic Valve Replacement, Transfemoral ECHOCARDIOGRAM, TRANSTHORACIC     Patient location during evaluation: Cath Lab Anesthesia Type: MAC Level of consciousness: awake and alert Pain management: pain level controlled Vital Signs Assessment: post-procedure vital signs reviewed and stable Respiratory status: spontaneous breathing, nonlabored ventilation, respiratory function stable and patient connected to nasal cannula oxygen Cardiovascular status: stable and blood pressure returned to baseline Postop Assessment: no apparent nausea or vomiting Anesthetic complications: no   No notable events documented.  Last Vitals:  Vitals:   11/22/23 0810 11/22/23 1219  BP: (!) 142/75 (!) 150/73  Pulse: 70 76  Resp: 13 20  Temp: 37 C 36.6 C  SpO2: 97% 96%    Last Pain:  Vitals:   11/22/23 1219  TempSrc: Oral  PainSc:                  Alanna Storti S

## 2023-11-28 NOTE — Progress Notes (Unsigned)
 [facility] VASC[facility]ENTER   MULTIDISCIPLINARY HEART VALVE CLINIC                                     Cardiology Office Note:    Date:  11/30/2023   ID:  Carlos Simon, DOB 1950-11-03, MRN [id number]  PCP:  Clinic, [facility]  [facility] Cardiologist:  Dr. Lucienne at the [facility] Structural heart: Lurena MARLA Red, MD [facility] Electrophysiologist:  None   Referring MD: Clinic, [facility] L[facility]   [facility] s/p TAVR  History of Present Illness:    Carlos Simon is a 73 y.o. male with a hx of HLD, CAD s/p PCI pLAD due to positive stress test 2024, IRBBB/LAFB and severe AS s/p TAVR (11/21/23) who presents to clinic for follow up.   Patient has been followed at the [place] and outside echo 06/2023 showed EF 55% and severe AS with mean grad 48 mmHg. Outside cath 08/2023 showed widely patent LAD stent with minimal nonobstructive disease elsewhere. He developed dizziness and SOB and referred to Dr. Red for consideration of TAVR. Now s/p 29 mm SAPIEN 3 valve from the left TF approach on 11/21/23. The patient developed a new LBBB with a pre-existing incomplete right bundle branch block and a left anterior fascicular block. A Zio AT was applied at discharge. POD 1 echo showed EF 70-75%, mild LVH, LVOT with a resting gradient of and a gradient with Valsalva up to resulting in mild chordal SAM and mild-mod MR. This may be consistent with hyperdynamic function immediately post TAVR. The was a normally functioning TAVR with a mean gradient of 10 mmHg and no PVL. He denies ever being diagnosed with hypertrophic cardiomyopathy. BB was not ordered given underlying conduction disease.   Today the patient presents to clinic for follow up. He has had some slight dizziness since TAVR but only in the mornings. No CP or SOB. No LE edema, orthopnea or PND. No dizziness or syncope. No blood in stool or urine. No palpitations. Worried he hasn't pressed the button on his monitor enough.    Past  Medical History:  Diagnosis Date   Aortic stenosis    Asthma    Seasonal allergies      Current Medications: Current Meds  Medication Sig   acetaminophen  (TYLENOL ) 500 MG tablet Take 500-1,000 mg by mouth every 6 (six) hours as needed for moderate pain (pain score 4-6).   Acidophilus Lactobacillus CAPS Take 2 capsules by mouth in the morning and at bedtime.   amoxicillin  (AMOXIL ) 500 MG capsule Take 4 capsules (2,000 mg total) by mouth 1 hour prior to dental work, including cleanings   amoxicillin  (AMOXIL ) 500 MG tablet Take 4 tablets (2,000 mg total) by mouth as directed. 1 hour prior to dental work including cleanings   aspirin  81 MG tablet Take 81 mg by mouth daily.   atorvastatin (LIPITOR) 40 MG tablet Take 40 mg by mouth daily.   Bacillus Coagulans-Inulin (BENEFIBER PREBIOTIC+PROBIOTIC PO) Take 2 Scoops by mouth every morning.   Emollient (EUCERIN ADVANCED REPAIR) CREA Apply 1 Application topically as needed (dry skin).   hydrocortisone cream 1 % Apply 1 Application topically as needed for itching.   Misc Natural Products (GLUCOSAMINE CHOND MSM FORMULA PO) Take 1 tablet by mouth daily.   Multiple Vitamins-Minerals (MULTIVITAMIN WITH MINERALS) tablet Take 1 tablet by mouth daily. Men's Multivitamin   psyllium (REGULOID) 0.52  g capsule Take 2 capsules by mouth in the morning and at bedtime.      ROS:   Please see the history of present illness.    All other systems reviewed and are negative.  EKGs   EKG Interpretation Date/Time:  Thursday November 30 2023 12:58:18 EDT Ventricular Rate:  76 PR Interval:  174 QRS Duration:  140 QT Interval:  424 QTC Calculation: 477 R Axis:   -65  Text Interpretation: Normal sinus rhythm Left axis deviation Left bundle branch block Confirmed by Sebastian Collar [phone]) on 11/30/2023 1:21:46 PM   Risk Assessment/Calculations:           Physical Exam:    VS:  BP 122/64   Pulse 76   Ht 5' 8 (1.727 m)   Wt 143 lb 3.2 oz (65 kg)    SpO2 98%   BMI 21.77 kg/m     Wt Readings from Last 3 Encounters:  11/30/23 143 lb 3.2 oz (65 kg)  11/22/23 142 lb 9.6 oz (64.7 kg)  11/09/23 151 lb (68.5 kg)     GEN: Well nourished, well developed in no acute distress NECK: No JVD CARDIAC: RRR, 1/6 SEM at RUSB. No rubs, gallops RESPIRATORY:  Clear to auscultation without rales, wheezing or rhonchi  ABDOMEN: Soft, non-tender, non-distended EXTREMITIES:  No edema; No deformity.  Groin sites clear without hematoma. Right groin and pubic ecchymosis  ASSESSMENT:    1. S/P TAVR (transcatheter aortic valve replacement)   2. Coronary artery disease involving native coronary artery of native heart without angina pectoris   3. LBBB (left bundle branch block)   4. Hyperlipidemia LDL goal <70   5. Left ventricular outflow tract obstruction   6. Thoracic aortic aneurysm without rupture, unspecified part (HCC)     PLAN:    In order of problems listed above:  Severe AS s/p TAVR:  -- Pt doing well s/p TAVR.  -- Groin sites healing well.  -- SBE prophylaxis discussed; I have RX'd amoxicillin .  -- Continue Aspirin  81mg  daily. -- Cleared to resume all activities without restriction. -- I will see back for 1 month echo and OV.  New LBBB with underlying RBBB and LAFB: -- Wearing Zio at with no high risk alerts.  -- ECG with sinus and LBBB.   CAD: -- Outside cath 08/2023 showed widely patent LAD stent with minimal nonobstructive disease elsewhere -- Continue aspirin  81mg  daily.  -- Continue Lipitor 40mg  daily.   HLD: -- Continue Lipitor 40mg  daily.   LVOT gradient: -- LVOT with a resting gradient of and a gradient with Valsalva up to resulting in mild chordal SAM and mild-mod MR. This may be consistent with hyperdynamic function immediately post TAVR.  -- Will monitor on serial echos. Will repeat in August.   TAA: -- 4.10mm on CT.  -- Will follow over time.    Medication Adjustments/Labs and Tests  Ordered: Current medicines are reviewed at length with the patient today.  Concerns regarding medicines are outlined above.  Orders Placed This Encounter  Procedures   EKG 12-Lead   ECHOCARDIOGRAM COMPLETE   Meds ordered this encounter  Medications   amoxicillin  (AMOXIL ) 500 MG tablet    Sig: Take 4 tablets (2,000 mg total) by mouth as directed. 1 hour prior to dental work including cleanings    Dispense:  12 tablet    Refill:  12    Supervising Provider:   COOPER, MICHAEL [[id number]]   amoxicillin  (AMOXIL ) 500 MG capsule  Sig: Take 4 capsules (2,000 mg total) by mouth 1 hour prior to dental work, including cleanings    Dispense:  12 capsule    Refill:  12    Supervising Provider:   WONDA SHARPER [[id number]]    Patient Instructions  Medication Instructions:  Start Amoxicillin  500 mg, take 4 tablets by mouth 1 hour prior to dental procedures and cleanings.  *If you need a refill on your cardiac medications before your next appointment, please call your pharmacy*  Lab Work: NONE NEEDED If you have labs (blood work) drawn today and your tests are completely normal, you will receive your results only by: MyChart Message (if you have MyChart) OR A paper copy in the mail If you have any lab test that is abnormal or we need to change your treatment, we will call you to review the results.  Testing/Procedures: 12/28/2023 Your physician has requested that you have an echocardiogram. Echocardiography is a painless test that uses sound waves to create images of your heart. It provides your doctor with information about the size and shape of your heart and how well your heart's chambers and valves are working. This procedure takes approximately one hour. There are no restrictions for this procedure. Please do NOT wear cologne, perfume, aftershave, or lotions (deodorant is allowed). Please arrive 15 minutes prior to your appointment time.  Please note: We ask at that you not bring children with you  during ultrasound (echo/ vascular) testing. Due to room size and safety concerns, children are not allowed in the ultrasound rooms during exams. Our front office staff cannot provide observation of children in our lobby area while testing is being conducted. An adult accompanying a patient to their appointment will only be allowed in the ultrasound room at the discretion of the ultrasound technician under special circumstances. We apologize for any inconvenience.   Follow-Up: At [facility], you and your health needs are our priority.  As part of our continuing mission to provide you with exceptional heart care, our providers are all part of one team.  This team includes your primary Cardiologist (physician) and Advanced Practice Providers or APPs (Physician Assistants and Nurse Practitioners) who all work together to provide you with the care you need, when you need it.  Your next appointment:   As scheduled  Provider:   Izetta Hummer, PA-C    We recommend signing up for the patient portal called MyChart.  Sign up information is provided on this After Visit Summary.  MyChart is used to connect with patients for Virtual Visits (Telemedicine).  Patients are able to view lab/test results, encounter notes, upcoming appointments, etc.  Non-urgent messages can be sent to your provider as well.   To learn more about what you can do with MyChart, go to [email or web address].        Signed, Lamarr Hummer, PA-C  11/30/2023 1:58 PM    [facility]

## 2023-11-30 ENCOUNTER — Other Ambulatory Visit (HOSPITAL_COMMUNITY): Payer: Self-pay

## 2023-11-30 ENCOUNTER — Ambulatory Visit: Attending: Physician Assistant | Admitting: Physician Assistant

## 2023-11-30 VITALS — BP 122/64 | HR 76 | Ht 68.0 in | Wt 143.2 lb

## 2023-11-30 DIAGNOSIS — I2510 Atherosclerotic heart disease of native coronary artery without angina pectoris: Secondary | ICD-10-CM | POA: Diagnosis not present

## 2023-11-30 DIAGNOSIS — I447 Left bundle-branch block, unspecified: Secondary | ICD-10-CM

## 2023-11-30 DIAGNOSIS — I712 Thoracic aortic aneurysm, without rupture, unspecified: Secondary | ICD-10-CM

## 2023-11-30 DIAGNOSIS — Z952 Presence of prosthetic heart valve: Secondary | ICD-10-CM

## 2023-11-30 DIAGNOSIS — Q248 Other specified congenital malformations of heart: Secondary | ICD-10-CM

## 2023-11-30 DIAGNOSIS — E785 Hyperlipidemia, unspecified: Secondary | ICD-10-CM | POA: Diagnosis not present

## 2023-11-30 MED ORDER — AMOXICILLIN 500 MG PO CAPS
2000.0000 mg | ORAL_CAPSULE | ORAL | 12 refills | Status: AC
Start: 2023-11-30 — End: ?
  Filled 2023-11-30: qty 12, 3d supply, fill #0

## 2023-11-30 MED ORDER — AMOXICILLIN 500 MG PO TABS: 2000.0000 mg | ORAL_TABLET | ORAL | 12 refills | Status: AC

## 2023-11-30 NOTE — Patient Instructions (Signed)
 Medication Instructions:  Start Amoxicillin  500 mg, take 4 tablets by mouth 1 hour prior to dental procedures and cleanings.  *If you need a refill on your cardiac medications before your next appointment, please call your pharmacy*  Lab Work: NONE NEEDED If you have labs (blood work) drawn today and your tests are completely normal, you will receive your results only by: MyChart Message (if you have MyChart) OR A paper copy in the mail If you have any lab test that is abnormal or we need to change your treatment, we will call you to review the results.  Testing/Procedures: 12/28/2023 Your physician has requested that you have an echocardiogram. Echocardiography is a painless test that uses sound waves to create images of your heart. It provides your doctor with information about the size and shape of your heart and how well your heart's chambers and valves are working. This procedure takes approximately one hour. There are no restrictions for this procedure. Please do NOT wear cologne, perfume, aftershave, or lotions (deodorant is allowed). Please arrive 15 minutes prior to your appointment time.  Please note: We ask at that you not bring children with you during ultrasound (echo/ vascular) testing. Due to room size and safety concerns, children are not allowed in the ultrasound rooms during exams. Our front office staff cannot provide observation of children in our lobby area while testing is being conducted. An adult accompanying a patient to their appointment will only be allowed in the ultrasound room at the discretion of the ultrasound technician under special circumstances. We apologize for any inconvenience.   Follow-Up: At [facility], you and your health needs are our priority.  As part of our continuing mission to provide you with exceptional heart care, our providers are all part of one team.  This team includes your primary Cardiologist (physician) and Advanced Practice  Providers or APPs (Physician Assistants and Nurse Practitioners) who all work together to provide you with the care you need, when you need it.  Your next appointment:   As scheduled  Provider:   Izetta Hummer, PA-C    We recommend signing up for the patient portal called MyChart.  Sign up information is provided on this After Visit Summary.  MyChart is used to connect with patients for Virtual Visits (Telemedicine).  Patients are able to view lab/test results, encounter notes, upcoming appointments, etc.  Non-urgent messages can be sent to your provider as well.   To learn more about what you can do with MyChart, go to [email or web address].

## 2023-12-18 ENCOUNTER — Ambulatory Visit: Payer: Self-pay | Admitting: Internal Medicine

## 2023-12-18 DIAGNOSIS — I444 Left anterior fascicular block: Secondary | ICD-10-CM | POA: Diagnosis not present

## 2023-12-18 DIAGNOSIS — I447 Left bundle-branch block, unspecified: Secondary | ICD-10-CM | POA: Diagnosis not present

## 2023-12-18 NOTE — Addendum Note (Signed)
 Encounter addended by: Malvina Pina A on: 12/18/2023 4:13 PM  Actions taken: Imaging Exam ended

## 2023-12-28 ENCOUNTER — Ambulatory Visit (HOSPITAL_COMMUNITY)

## 2023-12-28 ENCOUNTER — Ambulatory Visit: Admitting: Physician Assistant

## 2024-01-12 NOTE — Telephone Encounter (Signed)
 Advised pt dr transfer request to Dr Carlie for a second opinion was denied per Dr Carlie stating he had nothing more to offer than Dr Maggie.

## 2024-01-16 ENCOUNTER — Other Ambulatory Visit: Payer: Self-pay | Admitting: Physician Assistant

## 2024-01-16 DIAGNOSIS — Z952 Presence of prosthetic heart valve: Secondary | ICD-10-CM

## 2024-01-18 ENCOUNTER — Ambulatory Visit (INDEPENDENT_AMBULATORY_CARE_PROVIDER_SITE_OTHER)

## 2024-01-18 DIAGNOSIS — Z952 Presence of prosthetic heart valve: Secondary | ICD-10-CM

## 2024-01-19 ENCOUNTER — Ambulatory Visit: Payer: Self-pay | Admitting: Physician Assistant

## 2024-01-19 ENCOUNTER — Telehealth: Payer: Self-pay | Admitting: Physician Assistant

## 2024-01-19 LAB — ECHOCARDIOGRAM COMPLETE
AR max vel: 3.53 cm2
AV Area VTI: 3.46 cm2
AV Area mean vel: 3.31 cm2
AV Mean grad: 7 mmHg
AV Peak grad: 12.4 mmHg
Ao pk vel: 1.76 m/s
Area-P 1/2: 3.27 cm2
MV M vel: 6.18 m/s
MV Peak grad: 152.8 mmHg
Radius: 0.4 cm
S' Lateral: 2.42 cm

## 2024-01-19 NOTE — Telephone Encounter (Signed)
  [facility]   MULTIDISCIPLINARY HEART VALVE TEAM   Due to scheduling issues due to wife's knee surgery our 1 month echo/OV were rescheduled.   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   He has NYHA class I symptoms with no issues.   [place] Cardiomyopathy Questionnaire     01/19/2024   11:46 AM 10/06/2023    2:57 PM 10/05/2023    9:57 AM  [facility]Q-12  1 a. Ability to shower/bathe Not at all limited Not at all limited Not at all limited  1 b. Ability to walk 1 block Not at all limited Not at all limited Not at all limited  1 c. Ability to hurry/jog Other, Did not do Slightly limited Not at all limited  2. Edema feet/ankles/legs Never over the past 2 weeks Never over the past 2 weeks Never over the past 2 weeks  3. Limited by fatigue Never over the past 2 weeks Never over the past 2 weeks Never over the past 2 weeks  4. Limited by dyspnea Never over the past 2 weeks Never over the past 2 weeks Never over the past 2 weeks  5. Sitting up / on 3+ pillows Never over the past 2 weeks Never over the past 2 weeks Never over the past 2 weeks  6. Limited enjoyment of life Not limited at all Moderately limited Moderately limited  7. Rest of life w/ symptoms Completely satisfied Not at all satisfied Not at all satisfied  8 a. Participation in hobbies Did not limit at all Slightly limited Moderately limited  8 b. Participation in chores Did not limit at all Slightly limited Slightly limited  8 c. Visiting family/friends Did not limit at all Moderately limited Limited quite a bit     I will see him back for 1 year follow up in July 2026.  Lamarr Hummer PA-C  [facility]

## 2024-11-28 ENCOUNTER — Ambulatory Visit: Admitting: Physician Assistant

## 2024-11-28 ENCOUNTER — Other Ambulatory Visit (HOSPITAL_COMMUNITY)
